# Patient Record
Sex: Female | Born: 2000 | Race: Black or African American | Hispanic: No | Marital: Single | Smoking: Never smoker
Health system: Southern US, Community
[De-identification: ages and names within clinical notes are randomized; demographics above are authoritative.]

## PROBLEM LIST (undated history)

## (undated) DIAGNOSIS — M1990 Unspecified osteoarthritis, unspecified site: Secondary | ICD-10-CM

## (undated) DIAGNOSIS — J45909 Unspecified asthma, uncomplicated: Secondary | ICD-10-CM

## (undated) HISTORY — PX: WISDOM TOOTH EXTRACTION: SHX21

## (undated) HISTORY — DX: Unspecified asthma, uncomplicated: J45.909

## (undated) HISTORY — PX: KELOID EXCISION: SHX1856

---

## 2019-01-23 ENCOUNTER — Emergency Department: Payer: Medicaid Other

## 2019-01-23 ENCOUNTER — Encounter: Payer: Self-pay | Admitting: Emergency Medicine

## 2019-01-23 ENCOUNTER — Other Ambulatory Visit: Payer: Self-pay

## 2019-01-23 ENCOUNTER — Emergency Department
Admission: EM | Admit: 2019-01-23 | Discharge: 2019-01-23 | Disposition: A | Discharge status: Home / Self Care | Payer: Medicaid Other | Attending: Emergency Medicine | Admitting: Emergency Medicine

## 2019-01-23 DIAGNOSIS — R0789 Other chest pain: Secondary | ICD-10-CM | POA: Diagnosis present

## 2019-01-23 DIAGNOSIS — R079 Chest pain, unspecified: Secondary | ICD-10-CM

## 2019-01-23 LAB — CBC WITH DIFFERENTIAL/PLATELET
Abs Immature Granulocytes: 0.04 10*3/uL (ref 0.00–0.07)
Basophils Absolute: 0 10*3/uL (ref 0.0–0.1)
Basophils Relative: 0 %
Eosinophils Absolute: 0.1 10*3/uL (ref 0.0–0.5)
Eosinophils Relative: 1 %
HCT: 34.8 % — ABNORMAL LOW (ref 36.0–46.0)
Hemoglobin: 10.3 g/dL — ABNORMAL LOW (ref 12.0–15.0)
Immature Granulocytes: 0 %
Lymphocytes Relative: 29 %
Lymphs Abs: 2.7 10*3/uL (ref 0.7–4.0)
MCH: 19.3 pg — ABNORMAL LOW (ref 26.0–34.0)
MCHC: 29.6 g/dL — ABNORMAL LOW (ref 30.0–36.0)
MCV: 65.2 fL — ABNORMAL LOW (ref 80.0–100.0)
Monocytes Absolute: 0.7 10*3/uL (ref 0.1–1.0)
Monocytes Relative: 8 %
Neutro Abs: 5.8 10*3/uL (ref 1.7–7.7)
Neutrophils Relative %: 62 %
Platelets: 420 10*3/uL — ABNORMAL HIGH (ref 150–400)
RBC: 5.34 MIL/uL — ABNORMAL HIGH (ref 3.87–5.11)
RDW: 18.9 % — ABNORMAL HIGH (ref 11.5–15.5)
WBC: 9.3 10*3/uL (ref 4.0–10.5)
nRBC: 0 % (ref 0.0–0.2)

## 2019-01-23 LAB — COMPREHENSIVE METABOLIC PANEL
ALT: 14 U/L (ref 0–44)
AST: 16 U/L (ref 15–41)
Albumin: 3.8 g/dL (ref 3.5–5.0)
Alkaline Phosphatase: 55 U/L (ref 38–126)
Anion gap: 9 (ref 5–15)
BUN: 11 mg/dL (ref 6–20)
CO2: 24 mmol/L (ref 22–32)
Calcium: 9 mg/dL (ref 8.9–10.3)
Chloride: 107 mmol/L (ref 98–111)
Creatinine, Ser: 0.85 mg/dL (ref 0.44–1.00)
GFR calc Af Amer: >60 mL/min (ref >60)
GFR calc non Af Amer: >60 mL/min (ref >60)
Glucose, Bld: 95 mg/dL (ref 70–99)
Potassium: 4 mmol/L (ref 3.5–5.1)
Sodium: 140 mmol/L (ref 135–145)
Total Bilirubin: 0.5 mg/dL (ref 0.3–1.2)
Total Protein: 7.4 g/dL (ref 6.5–8.1)

## 2019-01-23 LAB — TROPONIN I (HIGH SENSITIVITY)
Troponin I (High Sensitivity): <2 ng/L (ref <18)
Troponin I (High Sensitivity): <2 ng/L (ref <18)

## 2019-01-23 MED ORDER — FAMOTIDINE 20 MG PO TABS: 20.0000 mg | ORAL_TABLET | Freq: Two times a day (BID) | ORAL | 1 refills | Status: DC

## 2019-01-23 MED ORDER — DIAZEPAM 5 MG PO TABS
5.0000 mg | ORAL_TABLET | Freq: Once | ORAL | Status: AC
  Administered 2019-01-23: 09:00:00 5 mg via ORAL
  Filled 2019-01-23: qty 1

## 2019-01-23 MED ORDER — DIAZEPAM 5 MG PO TABS: 5.0000 mg | ORAL_TABLET | Freq: Three times a day (TID) | ORAL | 0 refills | Status: DC | PRN
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
## 2019-01-23 NOTE — ED Notes (Signed)
Pt ambulated out of department independently. Pt to call friend for ride home.
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
## 2019-01-23 NOTE — ED Provider Notes (Signed)
[facility] Emergency Department Provider Note       Time seen: ----------------------------------------- 8:31 AM on 01/23/2019 -----------------------------------------   I have reviewed the triage vital signs and the nursing notes.  HISTORY   Chief Complaint Chest Pain    HPI Brooke Wall is a 19 y.o. female with a history of thalassemia who presents to the ED for upper chest pain that radiates into her left axilla for last 4 days.  Discomfort is 5 out of 10, nothing makes it better or worse.  She denies fevers, chills or other complaints.  History reviewed. No pertinent past medical history.  There are no problems to display for this patient.   History reviewed. No pertinent surgical history.  Allergies Patient has no known allergies.  Social History Social History   Tobacco Use  . Smoking status: Never Smoker  . Smokeless tobacco: Never Used  Substance Use Topics  . Alcohol use: Not on file  . Drug use: Not on file    Review of Systems Constitutional: Negative for fever. Cardiovascular: Positive for chest pain Respiratory: Negative for shortness of breath. Gastrointestinal: Negative for abdominal pain, vomiting and diarrhea. Musculoskeletal: Negative for back pain. Skin: Negative for rash. Neurological: Negative for headaches, focal weakness or numbness.  All systems negative/normal/unremarkable except as stated in the HPI  ____________________________________________   PHYSICAL EXAM:  VITAL SIGNS: ED Triage Vitals  Enc Vitals Group     BP 01/23/19 0348 135/64     Pulse Rate 01/23/19 0348 75     Resp 01/23/19 0348 19     Temp 01/23/19 0348 99.3 F (37.4 C)     Temp Source 01/23/19 0348 Oral     SpO2 01/23/19 0348 100 %     Weight 01/23/19 0346 240 lb (108.9 kg)     Height 01/23/19 0346 5\' 7"  (1.702 m)     Head Circumference --      Peak Flow --      Pain Score 01/23/19 0346 5     Pain Loc --      Pain Edu? --       Excl. in GC? --     Constitutional: Alert and oriented. Well appearing and in no distress. Eyes: Conjunctivae are normal. Normal extraocular movements. ENT      Head: Normocephalic and atraumatic.      Nose: No congestion/rhinnorhea.      Mouth/Throat: Mucous membranes are moist.      Neck: No stridor. Cardiovascular: Normal rate, regular rhythm. No murmurs, rubs, or gallops. Respiratory: Normal respiratory effort without tachypnea nor retractions. Breath sounds are clear and equal bilaterally. No wheezes/rales/rhonchi. Gastrointestinal: Soft and nontender. Normal bowel sounds Musculoskeletal: Nontender with normal range of motion in extremities. No lower extremity tenderness nor edema. Neurologic:  Normal speech and language. No gross focal neurologic deficits are appreciated.  Skin:  Skin is warm, dry and intact. No rash noted. Psychiatric: Mood and affect are normal. Speech and behavior are normal.  ____________________________________________  EKG: Interpreted by me.  Sinus rhythm with a rate of 73 bpm, normal PR interval, normal QRS, normal QT  ____________________________________________  ED COURSE:  As part of my medical decision making, I reviewed the following data within the electronic MEDICAL RECORD NUMBER History obtained from family if available, nursing notes, old chart and ekg, as well as notes from prior ED visits. Patient presented for chest pain, we will assess with labs and imaging as indicated at this time.   Procedures  03/23/19  Brooke Wall was evaluated in Emergency Department on 01/23/2019 for the symptoms described in the history of present illness. She was evaluated in the context of the global COVID-19 pandemic, which necessitated consideration that the patient might be at risk for infection with the SARS-CoV-2 virus that causes COVID-19. Institutional protocols and algorithms that pertain to the evaluation of patients at risk for COVID-19 are in a state of rapid change  based on information released by regulatory bodies including the CDC and federal and state organizations. These policies and algorithms were followed during the patient's care in the ED.  ____________________________________________   LABS (pertinent positives/negatives)  Labs Reviewed  CBC WITH DIFFERENTIAL/PLATELET - Abnormal; Notable for the following components:      Result Value   RBC 5.34 (*)    Hemoglobin 10.3 (*)    HCT 34.8 (*)    MCV 65.2 (*)    MCH 19.3 (*)    MCHC 29.6 (*)    RDW 18.9 (*)    Platelets 420 (*)    All other components within normal limits  COMPREHENSIVE METABOLIC PANEL  TROPONIN I (HIGH SENSITIVITY)  TROPONIN I (HIGH SENSITIVITY)    RADIOLOGY  Chest x-ray is unremarkable  ____________________________________________   DIFFERENTIAL DIAGNOSIS   Musculoskeletal pain, GERD, anxiety, anemia, angina unlikely  FINAL ASSESSMENT AND PLAN  Chest pain   Plan: The patient had presented for nonspecific chest pain. Patient's labs were reassuring. Patient's imaging was also unremarkable.  No clear etiology was discovered, I will try antacids and muscle relaxants.  She is cleared for outpatient follow-up.   Laurence Aly, MD    Note: This note was generated in part or whole with voice recognition software. Voice recognition is usually quite accurate but there are transcription errors that can and very often do occur. I apologize for any typographical errors that were not detected and corrected.     Earleen Newport, MD 01/23/19 [id number]

## 2019-02-06 ENCOUNTER — Ambulatory Visit: Payer: Medicaid Other | Attending: Internal Medicine

## 2019-02-06 DIAGNOSIS — Z20822 Contact with and (suspected) exposure to covid-19: Secondary | ICD-10-CM

## 2019-02-07 LAB — NOVEL CORONAVIRUS, NAA: SARS-CoV-2, NAA: NOT DETECTED

## 2019-06-29 ENCOUNTER — Encounter (HOSPITAL_BASED_OUTPATIENT_CLINIC_OR_DEPARTMENT_OTHER): Payer: Self-pay | Admitting: *Deleted

## 2019-06-29 ENCOUNTER — Ambulatory Visit (HOSPITAL_COMMUNITY)
Admission: EM | Admit: 2019-06-29 | Discharge: 2019-06-29 | Disposition: A | Discharge status: Home / Self Care | Payer: No Typology Code available for payment source | Source: Ambulatory Visit | Attending: Emergency Medicine | Admitting: Emergency Medicine

## 2019-06-29 ENCOUNTER — Emergency Department (HOSPITAL_BASED_OUTPATIENT_CLINIC_OR_DEPARTMENT_OTHER)
Admission: EM | Admit: 2019-06-29 | Discharge: 2019-06-29 | Disposition: A | Discharge status: Home / Self Care | Payer: Medicaid Other | Attending: Emergency Medicine | Admitting: Emergency Medicine

## 2019-06-29 ENCOUNTER — Other Ambulatory Visit: Payer: Self-pay

## 2019-06-29 DIAGNOSIS — Z79899 Other long term (current) drug therapy: Secondary | ICD-10-CM | POA: Diagnosis not present

## 2019-06-29 DIAGNOSIS — Z0441 Encounter for examination and observation following alleged adult rape: Secondary | ICD-10-CM | POA: Insufficient documentation

## 2019-06-29 DIAGNOSIS — T7421XA Adult sexual abuse, confirmed, initial encounter: Secondary | ICD-10-CM

## 2019-06-29 DIAGNOSIS — E669 Obesity, unspecified: Secondary | ICD-10-CM | POA: Insufficient documentation

## 2019-06-29 HISTORY — DX: Unspecified osteoarthritis, unspecified site: M19.90

## 2019-06-29 LAB — PREGNANCY, URINE: Preg Test, Ur: NEGATIVE

## 2019-06-29 MED ORDER — AZITHROMYCIN 250 MG PO TABS
1000.0000 mg | ORAL_TABLET | Freq: Once | ORAL | Status: AC
  Administered 2019-06-29: 1000 mg via ORAL

## 2019-06-29 MED ORDER — PROMETHAZINE HCL 25 MG PO TABS
25.0000 mg | ORAL_TABLET | Freq: Four times a day (QID) | ORAL | Status: DC | PRN
  Administered 2019-06-29: 75 mg via ORAL

## 2019-06-29 MED ORDER — ULIPRISTAL ACETATE 30 MG PO TABS
30.0000 mg | ORAL_TABLET | Freq: Once | ORAL | Status: AC
  Administered 2019-06-29: 30 mg via ORAL
  Filled 2019-06-29: qty 1

## 2019-06-29 MED ORDER — METRONIDAZOLE 500 MG PO TABS
2000.0000 mg | ORAL_TABLET | Freq: Once | ORAL | Status: AC
  Administered 2019-06-29: 2000 mg via ORAL

## 2019-06-29 MED ORDER — LIDOCAINE HCL (PF) 1 % IJ SOLN
0.9000 mL | Freq: Once | INTRAMUSCULAR | Status: AC
  Administered 2019-06-29: 0.9 mL

## 2019-06-29 MED ORDER — CEFTRIAXONE SODIUM 500 MG IJ SOLR
250.0000 mg | Freq: Once | INTRAMUSCULAR | Status: AC
  Administered 2019-06-29: 500 mg via INTRAMUSCULAR
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
## 2019-06-29 NOTE — ED Notes (Signed)
Patient unable to wait to use the bathroom.  Instructed to pat dry and place tissue in paper bag which remains in the room currently with the patient.   Mother at the bedside.
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
## 2019-06-29 NOTE — ED Notes (Signed)
Pt is aware that we are awaiting Sane Evaluation, which will be closer to 6-7am. Dr. Wilkie Aye would like pt to remain NPO and she is aware of this. Girlfriend at bedside.
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
## 2019-06-29 NOTE — Discharge Instructions (Signed)
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 If you miss a dose, take it as soon as you can. If it is almost time for your next dose, take only that dose. Do  not take double or extra doses. What may interact with this medicine? Do not take this medicine with any of the following medications:  alcohol or any product that contains alcohol  cisapride  disulfiram  dronedarone  pimozide  thioridazine This medicine may also interact with the following medications:  amiodarone  birth control pills  busulfan  carbamazepine  cimetidine  cyclosporine  fluorouracil  lithium  other medicines that prolong the QT interval (cause an abnormal heart rhythm) like dofetilide, ziprasidone  phenobarbital  phenytoin  quinidine  tacrolimus  vecuronium  warfarin This list may not describe all possible interactions. Give your health care  provider a list of all the medicines, herbs, non-prescription drugs, or dietary supplements you use. Also tell them if you smoke, drink alcohol, or use illegal drugs. Some items may interact with your medicine. What should I watch for while using this medicine? Tell your doctor or health care professional if your symptoms do not improve or if they get worse. You may get drowsy or dizzy. Do not drive, use machinery, or do anything that needs mental alertness until you know how this medicine affects you. Do not stand or sit up quickly, especially if you are an older patient. This reduces the risk of dizzy or fainting spells. Ask your doctor or health care professional if you should avoid alcohol. Many nonprescription cough and cold products contain alcohol. Metronidazole can cause an unpleasant reaction when taken with alcohol. The reaction includes flushing, headache, nausea, vomiting, sweating, and increased thirst. The reaction can last from 30 minutes to several hours. If you are being treated for a sexually transmitted disease, avoid sexual contact until you have finished your treatment. Your sexual partner may also need treatment. What side effects may I notice from receiving this medicine? Side effects that you should report to your doctor or health care professional as soon as possible:  allergic reactions like skin rash or hives, swelling of the face, lips, or tongue  confusion  fast, irregular heartbeat  fever, chills, sore throat  fever with rash, swollen lymph nodes, or swelling of the face  pain, tingling, numbness in the hands or feet  redness, blistering, peeling or loosening of the skin, including inside the mouth  seizures  sign and symptoms of liver injury like dark yellow or brown urine; general ill feeling or flu-like symptoms; light colored stools; loss of appetite; nausea; right upper belly pain; unusually weak or tired; yellowing of the eyes or skin  vaginal discharge,  itching, or odor in women Side effects that usually do not require medical attention (report to your doctor or health care professional if they continue or are bothersome):  changes in taste  diarrhea  headache  nausea, vomiting  stomach pain This list may not describe all possible side effects. Call your doctor for medical advice about side effects. You may report side effects to FDA at 1-800-FDA-1088. Where should I keep my medicine? Keep out of the reach of children. Store at room temperature below 25 degrees C (77 degrees F). Protect from light. Keep container tightly closed. Throw away any unused medicine after the expiration date. NOTE: This sheet is a summary. It may not cover all possible information. If you have questions about this medicine, talk to your doctor, pharmacist, or health care provider.  2020 Elsevier/Gold Standard (2017-12-31 06:52:33)   Ceftriaxone (Injection/Shot) Also known as:  Rocephin  Ceftriaxone injection What is this medicine? CEFTRIAXONE (sef try AX one) is a cephalosporin antibiotic. It is  used to treat certain kinds of bacterial infections. It will not work for colds, flu, or other viral infections. This medicine may be used for other purposes; ask your health care provider or pharmacist if you have questions. COMMON BRAND NAME(S): Ceftrisol Plus, Rocephin What should I tell my health care provider before I take this medicine? They need to know if you have any of these conditions:  any chronic illness  bowel disease, like colitis  both kidney and liver disease  high bilirubin level in newborn patients  an unusual or allergic reaction to ceftriaxone, other cephalosporin or penicillin antibiotics, foods, dyes, or preservatives  pregnant or trying to get pregnant  breast-feeding How should I use this medicine? This medicine is injected into a muscle or infused it into a vein. It is usually given in a medical office or clinic. If you are to  give this medicine you will be taught how to inject it. Follow instructions carefully. Use your doses at regular intervals. Do not take your medicine more often than directed. Do not skip doses or stop your medicine early even if you feel better. Do not stop taking except on your doctor's advice. Talk to your pediatrician regarding the use of this medicine in children. Special care may be needed. Overdosage: If you think you have taken too much of this medicine contact a poison control center or emergency room at once. NOTE: This medicine is only for you. Do not share this medicine with others. What if I miss a dose? If you miss a dose, take it as soon as you can. If it is almost time for your next dose, take only that dose. Do not take double or extra doses. What may interact with this medicine? Do not take this medicine with any of the following medications:  intravenous calcium This medicine may also interact with the following medications:  birth control pills This list may not describe all possible interactions. Give your health care provider a list of all the medicines, herbs, non-prescription drugs, or dietary supplements you use. Also tell them if you smoke, drink alcohol, or use illegal drugs. Some items may interact with your medicine. What should I watch for while using this medicine? Tell your doctor or health care provider if your symptoms do not improve or if they get worse. This medicine may cause serious skin reactions. They can happen weeks to months after starting the medicine. Contact your health care provider right away if you notice fevers or flu-like symptoms with a rash. The rash may be red or purple and then turn into blisters or peeling of the skin. Or, you might notice a red rash with swelling of the face, lips or lymph nodes in your neck or under your arms. Do not treat diarrhea with over the counter products. Contact your doctor if you have diarrhea that lasts more than 2  days or if it is severe and watery. If you are being treated for a sexually transmitted disease, avoid sexual contact until you have finished your treatment. Having sex can infect your sexual partner. Calcium may bind to this medicine and cause lung or kidney problems. Avoid calcium products while taking this medicine and for 48 hours after taking the last dose of this medicine. What side effects may I notice from receiving this medicine? Side effects that you should report to your doctor or health care professional as soon as possible:  allergic reactions like skin rash, itching or hives, swelling of the face,  lips, or tongue  breathing problems  fever, chills  irregular heartbeat  pain when passing urine  redness, blistering, peeling, or loosening of the skin, including inside the mouth  seizures  stomach pain, cramps  unusual bleeding, bruising  unusually weak or tired Side effects that usually do not require medical attention (report to your doctor or health care professional if they continue or are bothersome):  diarrhea  dizzy, drowsy  headache  nausea, vomiting  pain, swelling, irritation where injected  stomach upset  sweating This list may not describe all possible side effects. Call your doctor for medical advice about side effects. You may report side effects to FDA at 1-800-FDA-1088. Where should I keep my medicine? Keep out of the reach of children. Store at room temperature below 25 degrees C (77 degrees F). Protect from light. Throw away any unused vials after the expiration date. NOTE: This sheet is a summary. It may not cover all possible information. If you have questions about this medicine, talk to your doctor, pharmacist, or health care provider.  2020 Elsevier/Gold Standard (2018-04-11 10:10:06)    Promethazine (pack of 3 for home use) Also known as:  Phenergan  Promethazine tablets  What is this medicine? PROMETHAZINE (proe METH a zeen)  is an antihistamine. It is used to treat allergic reactions and to treat or prevent nausea and vomiting from illness or motion sickness. It is also used to make you sleep before surgery, and to help treat pain or nausea after surgery. This medicine may be used for other purposes; ask your health care provider or pharmacist if you have questions. COMMON BRAND NAME(S): Phenergan What should I tell my health care provider before I take this medicine? They need to know if you have any of these conditions:  glaucoma  high blood pressure or heart disease  kidney disease  liver disease  lung or breathing disease, like asthma  prostate trouble  pain or difficulty passing urine  seizures  an unusual or allergic reaction to promethazine or phenothiazines, other medicines, foods, dyes, or preservatives  pregnant or trying to get pregnant  breast-feeding How should I use this medicine? Take this medicine by mouth with a glass of water. Follow the directions on the prescription label. Take your doses at regular intervals. Do not take your medicine more often than directed. Talk to your pediatrician regarding the use of this medicine in children. Special care may be needed. This medicine should not be given to infants and children younger than 63 years old. Overdosage: If you think you have taken too much of this medicine contact a poison control center or emergency room at once. NOTE: This medicine is only for you. Do not share this medicine with others. What if I miss a dose? If you miss a dose, take it as soon as you can. If it is almost time for your next dose, take only that dose. Do not take double or extra doses. What may interact with this medicine? Do not take this medicine with any of the following medications:  cisapride  dronedarone  MAOIs like Carbex, Eldepryl, Marplan, Nardil, Parnate  pimozide  quinidine, including dextromethorphan; quinidine  thioridazine This  medicine may also interact with the following medications:  certain medicines for depression, anxiety, or psychotic disturbances  certain medicines for anxiety or sleep  certain medicines for seizures like carbamazepine, phenobarbital, phenytoin  certain medicines for movement abnormalities as in Parkinson's disease, or for gastrointestinal problems  epinephrine  medicines for allergies  or colds  muscle relaxants  narcotic medicines for pain  other medicines that prolong the QT interval (cause an abnormal heart rhythm) like dofetilide, ziprasidone  tramadol  trimethobenzamide This list may not describe all possible interactions. Give your health care provider a list of all the medicines, herbs, non-prescription drugs, or dietary supplements you use. Also tell them if you smoke, drink alcohol, or use illegal drugs. Some items may interact with your medicine. What should I watch for while using this medicine? Tell your doctor or health care professional if your symptoms do not start to get better in 1 to 2 days. You may get drowsy or dizzy. Do not drive, use machinery, or do anything that needs mental alertness until you know how this medicine affects you. To reduce the risk of dizzy or fainting spells, do not stand or sit up quickly, especially if you are an older patient. Alcohol may increase dizziness and drowsiness. Avoid alcoholic drinks. Your mouth may get dry. Chewing sugarless gum or sucking hard candy, and drinking plenty of water may help. Contact your doctor if the problem does not go away or is severe. This medicine may cause dry eyes and blurred vision. If you wear contact lenses you may feel some discomfort. Lubricating drops may help. See your eye doctor if the problem does not go away or is severe. This medicine can make you more sensitive to the sun. Keep out of the sun. If you cannot avoid being in the sun, wear protective clothing and use sunscreen. Do not use sun lamps  or tanning beds/booths. If you are diabetic, check your blood-sugar levels regularly. What side effects may I notice from receiving this medicine? Side effects that you should report to your doctor or health care professional as soon as possible:  blurred vision  irregular heartbeat, palpitations or chest pain  muscle or facial twitches  pain or difficulty passing urine  seizures  skin rash  slowed or shallow breathing  unusual bleeding or bruising  yellowing of the eyes or skin Side effects that usually do not require medical attention (report to your doctor or health care professional if they continue or are bothersome):  headache  nightmares, agitation, nervousness, excitability, not able to sleep (these are more likely in children)  stuffy nose This list may not describe all possible side effects. Call your doctor for medical advice about side effects. You may report side effects to FDA at 1-800-FDA-1088. Where should I keep my medicine? Keep out of the reach of children. Store at room temperature, between 20 and 25 degrees C (68 and 77 degrees F). Protect from light. Throw away any unused medicine after the expiration date. NOTE: This sheet is a summary. It may not cover all possible information. If you have questions about this medicine, talk to your doctor, pharmacist, or health care provider.  2020 Elsevier/Gold Standard (2017-12-31 08:46:17)

## 2020-09-28 IMAGING — CR DG CHEST 2V
2 series · 2 of 2 positions shown · non-contrast
Comparison: None.

CLINICAL DATA: Initial evaluation for acute chest pain.

EXAM:
CHEST - 2 VIEW

[chest pa]
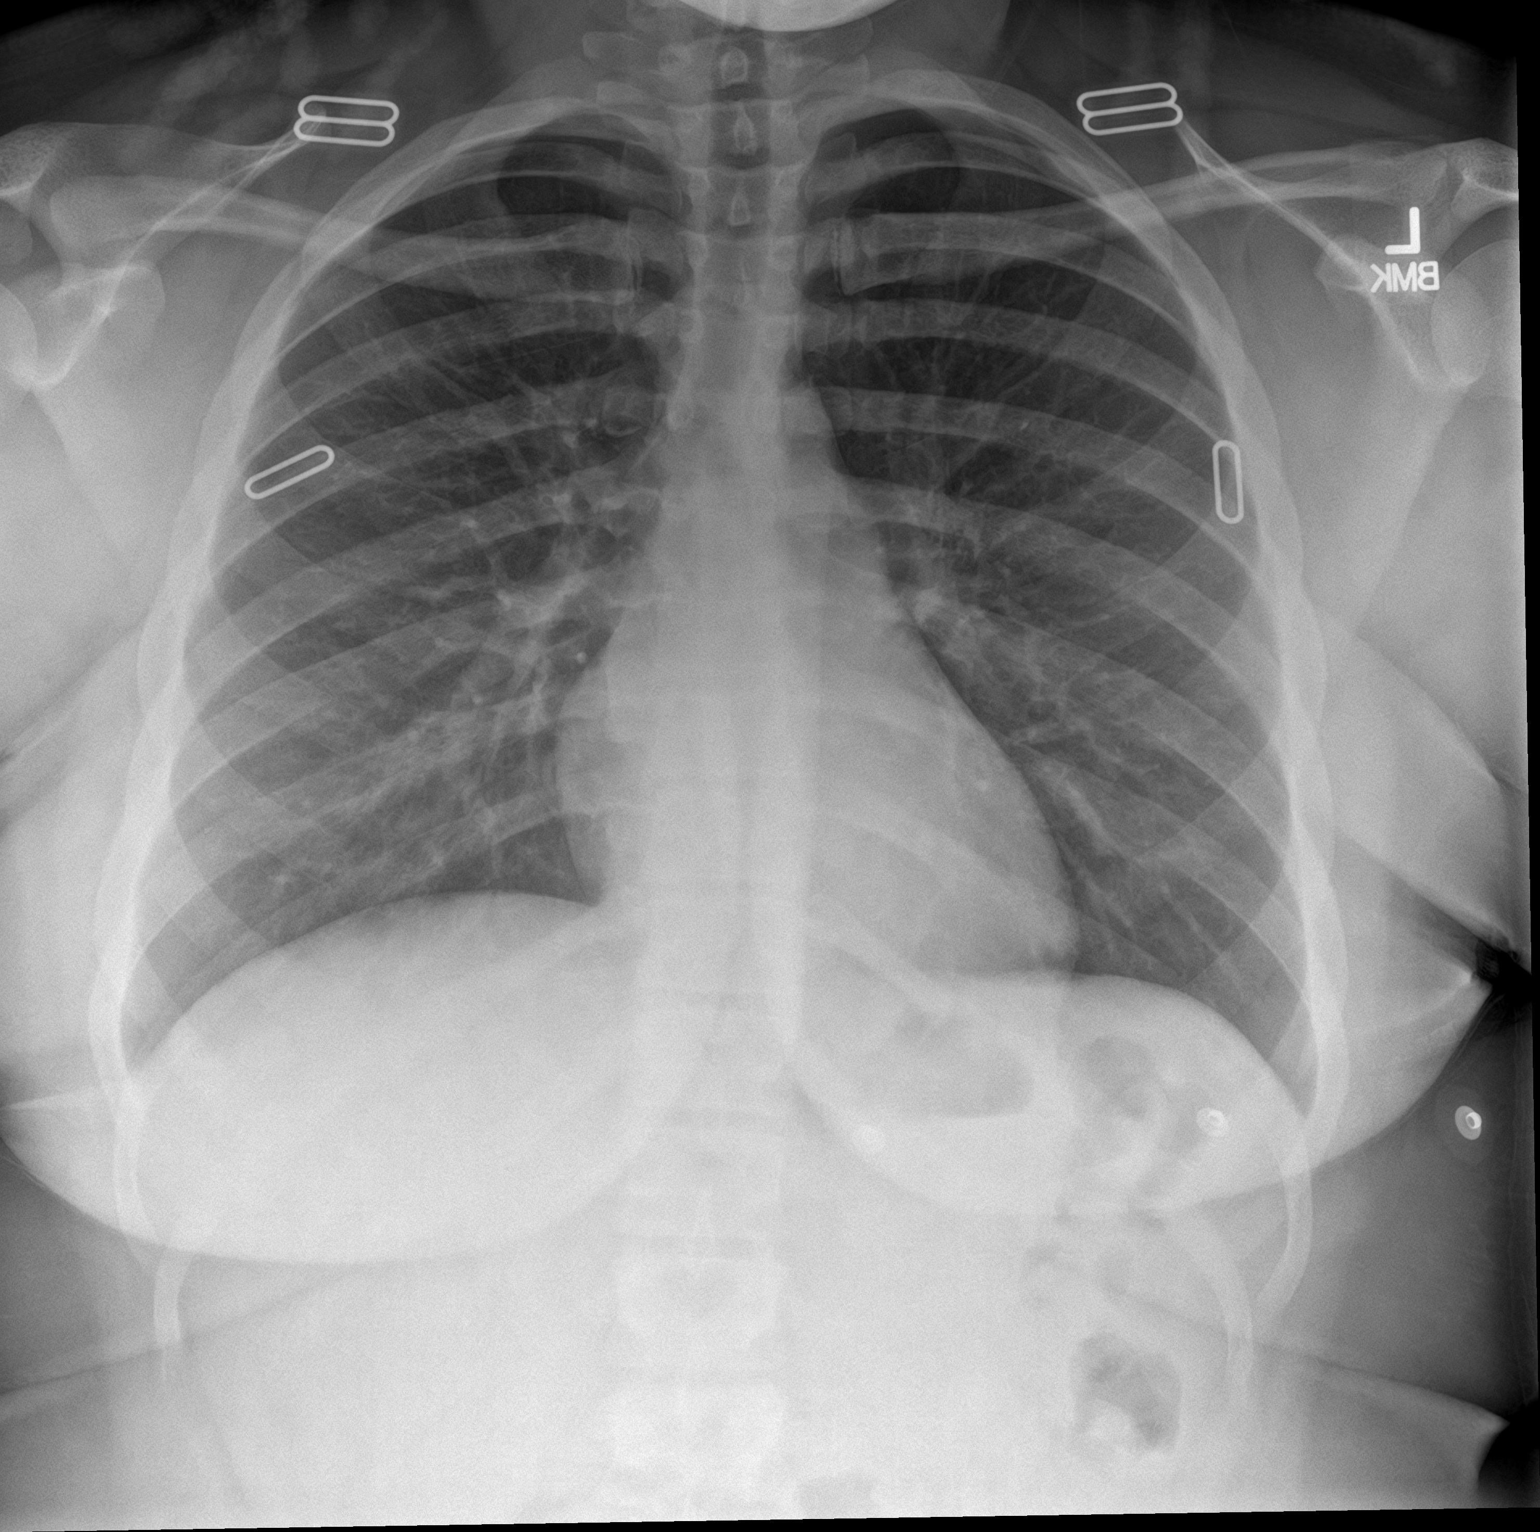

[chest lat]
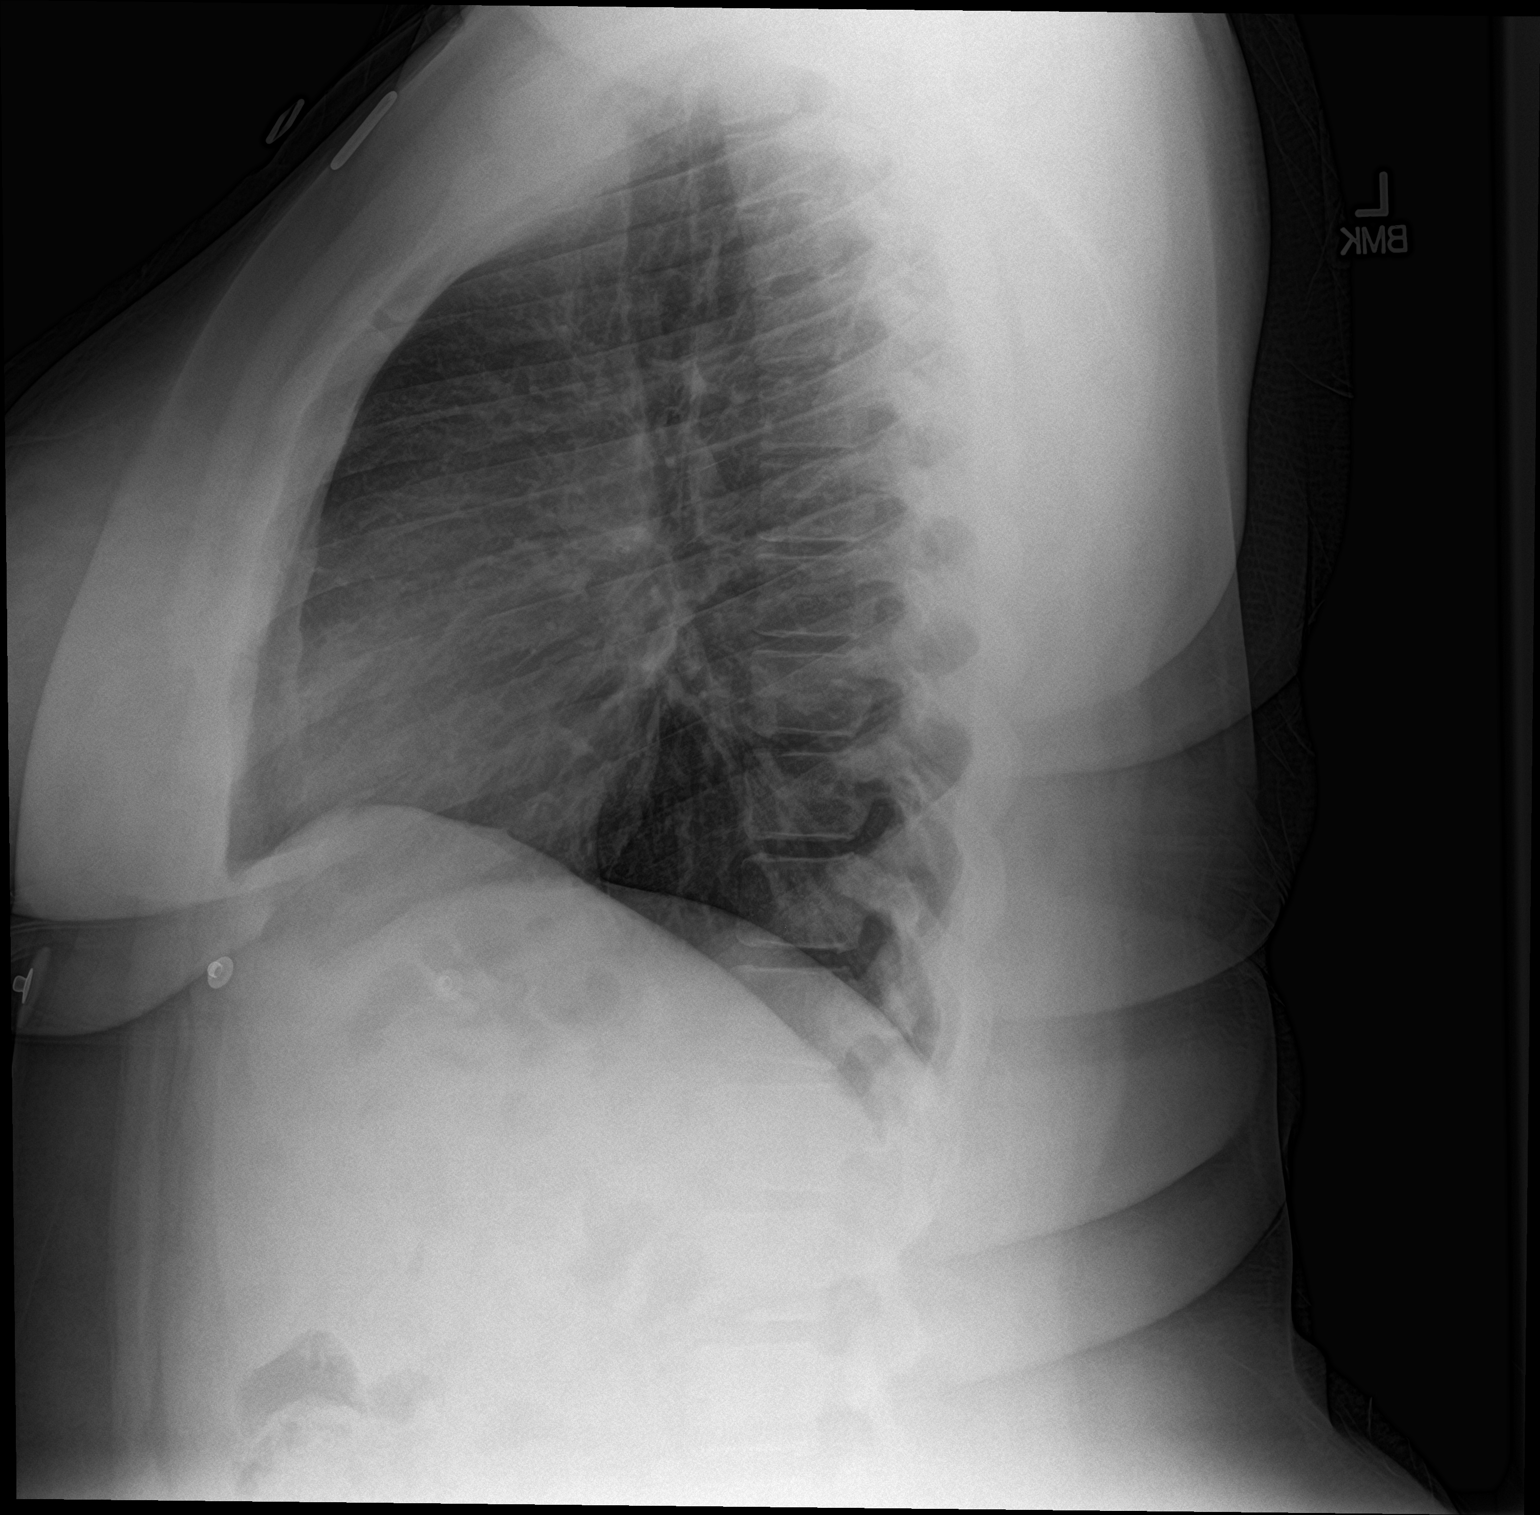

[2 of 2 positions shown; findings below may reference images not displayed]

FINDINGS: The cardiac and mediastinal silhouettes are within normal limits.

The lungs are normally inflated. No airspace consolidation, pleural
effusion, or pulmonary edema. No pneumothorax.

No acute osseous abnormality.
IMPRESSION: No active cardiopulmonary disease.

## 2020-10-12 ENCOUNTER — Emergency Department (HOSPITAL_COMMUNITY): Payer: Medicaid Other

## 2020-10-12 ENCOUNTER — Emergency Department (HOSPITAL_COMMUNITY)
Admission: EM | Admit: 2020-10-12 | Discharge: 2020-10-12 | Disposition: A | Discharge status: Home / Self Care | Payer: Medicaid Other | Attending: Emergency Medicine | Admitting: Emergency Medicine

## 2020-10-12 ENCOUNTER — Other Ambulatory Visit: Payer: Self-pay

## 2020-10-12 DIAGNOSIS — R0789 Other chest pain: Secondary | ICD-10-CM | POA: Diagnosis not present

## 2020-10-12 DIAGNOSIS — R0602 Shortness of breath: Secondary | ICD-10-CM | POA: Diagnosis not present

## 2020-10-12 DIAGNOSIS — R062 Wheezing: Secondary | ICD-10-CM | POA: Insufficient documentation

## 2020-10-12 DIAGNOSIS — Z5321 Procedure and treatment not carried out due to patient leaving prior to being seen by health care provider: Secondary | ICD-10-CM | POA: Insufficient documentation

## 2020-10-12 DIAGNOSIS — R079 Chest pain, unspecified: Secondary | ICD-10-CM | POA: Insufficient documentation

## 2020-10-12 LAB — CBC
HCT: 34.1 % — ABNORMAL LOW (ref 36.0–46.0)
Hemoglobin: 10 g/dL — ABNORMAL LOW (ref 12.0–15.0)
MCH: 18.4 pg — ABNORMAL LOW (ref 26.0–34.0)
MCHC: 29.3 g/dL — ABNORMAL LOW (ref 30.0–36.0)
MCV: 62.7 fL — ABNORMAL LOW (ref 80.0–100.0)
Platelets: 486 10*3/uL — ABNORMAL HIGH (ref 150–400)
RBC: 5.44 MIL/uL — ABNORMAL HIGH (ref 3.87–5.11)
RDW: 20.5 % — ABNORMAL HIGH (ref 11.5–15.5)
WBC: 11.1 10*3/uL — ABNORMAL HIGH (ref 4.0–10.5)
nRBC: 0 % (ref 0.0–0.2)

## 2020-10-12 LAB — BASIC METABOLIC PANEL
Anion gap: 8 (ref 5–15)
BUN: 6 mg/dL (ref 6–20)
CO2: 26 mmol/L (ref 22–32)
Calcium: 9.3 mg/dL (ref 8.9–10.3)
Chloride: 103 mmol/L (ref 98–111)
Creatinine, Ser: 0.82 mg/dL (ref 0.44–1.00)
GFR, Estimated: >60 mL/min (ref >60)
Glucose, Bld: 94 mg/dL (ref 70–99)
Potassium: 4.3 mmol/L (ref 3.5–5.1)
Sodium: 137 mmol/L (ref 135–145)

## 2020-10-12 LAB — TROPONIN I (HIGH SENSITIVITY)
Troponin I (High Sensitivity): <2 ng/L (ref <18)
Troponin I (High Sensitivity): <2 ng/L (ref <18)

## 2020-10-12 LAB — I-STAT BETA HCG BLOOD, ED (MC, WL, AP ONLY): I-stat hCG, quantitative: <5 m[IU]/mL (ref <5)

## 2020-10-12 NOTE — ED Provider Notes (Signed)
Emergency Medicine Provider Triage Evaluation Note  Brooke Wall , a 20 y.o. female  was evaluated in triage.  Pt complains of chest pain.  The patient reports constant, nonradiating chest pain, characterized as tightness, that began 2 days ago.  Pain is pleuritic.  No other known aggravating or alleviating factors.  She reports mild associated shortness of breath.  No cough, fever, chills, nausea, vomiting, diarrhea, back pain, abdominal pain, leg swelling, palpitations.  She has a childhood history of asthma, but has had no asthma exacerbations in a long time.  No treatment prior to arrival.  She does not take OCPs.  No recent surgery or long travel.  No history of VTE.  Review of Systems  Positive: Chest pain, shortness of breath Negative: Cough, fever, chills, nausea, vomiting, diarrhea, back pain, abdominal pain, leg swelling, palpitation  Physical Exam  BP 135/70 (BP Location: Right Arm)   Pulse 80   Temp 98.5 F (36.9 C)   Resp 17   LMP 09/26/2020   SpO2 100%  Gen:   Awake, no distress   Resp:  Normal effort  MSK:   Moves extremities without difficulty  Other:  Lungs are clear to auscultation bilaterally.  No increased work of breathing.  No adventitious breath sounds.  Medical Decision Making  Medically screening exam initiated at 12:36 AM.  Appropriate orders placed.  Brooke Wall was informed that the remainder of the evaluation will be completed by another provider, this initial triage assessment does not replace that evaluation, and the importance of remaining in the ED until their evaluation is complete.  Labs and imaging have been ordered.  She will require further work-up and evaluation in the emergency department.   Barkley Boards, PA-C 10/12/20 [id number]    Glynn Octave, MD 10/12/20 [phone]

## 2020-10-12 NOTE — ED Triage Notes (Signed)
Pt with left sided chest tightness, some sob, pain increased with breathing

## 2021-05-23 ENCOUNTER — Telehealth: Payer: Medicaid Other | Admitting: Physician Assistant

## 2021-05-23 DIAGNOSIS — T7840XA Allergy, unspecified, initial encounter: Secondary | ICD-10-CM | POA: Diagnosis not present

## 2021-05-23 MED ORDER — CEPHALEXIN 500 MG PO CAPS: 500.0000 mg | ORAL_CAPSULE | Freq: Three times a day (TID) | ORAL | 0 refills | Status: AC

## 2021-05-23 MED ORDER — METHYLPREDNISOLONE 4 MG PO TBPK: ORAL_TABLET | ORAL | 0 refills | Status: DC

## 2021-05-23 NOTE — Progress Notes (Signed)
?Virtual Visit Consent  ? ?Brooke Wall, you are scheduled for a virtual visit with a [facility] provider today.   ?  ?Just as with appointments in the office, your consent must be obtained to participate.  Your consent will be active for this visit and any virtual visit you may have with one of our providers in the next 365 days.   ?  ?If you have a MyChart account, a copy of this consent can be sent to you electronically.  All virtual visits are billed to your insurance company just like a traditional visit in the office.   ? ?As this is a virtual visit, video technology does not allow for your provider to perform a traditional examination.  This may limit your provider's ability to fully assess your condition.  If your provider identifies any concerns that need to be evaluated in person or the need to arrange testing (such as labs, EKG, etc.), we will make arrangements to do so.   ?  ?Although advances in technology are sophisticated, we cannot ensure that it will always work on either your end or our end.  If the connection with a video visit is poor, the visit may have to be switched to a telephone visit.  With either a video or telephone visit, we are not always able to ensure that we have a secure connection.   ? ?Also, by engaging in this virtual visit, you consent to the provision of healthcare. Additionally, you authorize for your insurance to be billed (if applicable) for the services provided during this visit. I also discussed with the patient that there may be a patient responsible charge related to this service. ? ?I need to obtain your verbal consent now.   Are you willing to proceed with your visit today?  ?  ?Leelee Bearden has provided verbal consent on 05/23/2021 for a virtual visit (video or telephone). ?  ?Leeanne Rio, PA-C  ? ?Date: 05/23/2021 10:28 AM ? ? ?Virtual Visit via Video Note  ? ?ILeeanne Rio, connected with  Sheilyn Orrison  ([id number], 04-Feb-2000) on 05/23/21 at 10:00  AM EDT by a video-enabled telemedicine application and verified that I am speaking with the correct person using two identifiers. ? ?Location: ?Patient: Virtual Visit Location Patient: Home ?Provider: Virtual Visit Location Provider: Home Office ?  ?I discussed the limitations of evaluation and management by telemedicine and the availability of in person appointments. The patient expressed understanding and agreed to proceed.   ? ?History of Present Illness: ?Brooke Wall is a 21 y.o. who identifies as a female who was assigned female at birth, and is being seen today for itchy and swollen bites of the left side of her neck along with a bite of fingers of R hand with substantial swelling. Symptoms first noted upon waking this morning. Notes areas are quite itchy and warm to the touch without pain. Denies drainage from the areas. Denies fever, chills or malaise. Denies any recent travel or sick contact. No new pets. Denies new furniture, etc.  ? ?HPI: HPI  ?Problems: There are no problems to display for this patient. ?  ?Allergies: No Known Allergies ?Medications:  ?Current Outpatient Medications:  ?  cephALEXin (KEFLEX) 500 MG capsule, Take 1 capsule (500 mg total) by mouth 3 (three) times daily for 7 days., Disp: 21 capsule, Rfl: 0 ?  methylPREDNISolone (MEDROL DOSEPAK) 4 MG TBPK tablet, Take following package directions., Disp: 21 tablet, Rfl: 0 ? ?Observations/Objective: ?Patient is well-developed,  well-nourished in no acute distress.  ?Resting comfortably at home.  ?Head is normocephalic, atraumatic.  ?No labored breathing. ?Speech is clear and coherent with logical content.  ?Patient is alert and oriented at baseline.  ?Three quarter-sized bites of neck with erythema and focal soft tissue swelling noted of L anterior and lateral neck. Swelling of third and fourth phalanges of R hand noted with small bite noted of the hand with more localized soft tissue swelling. Again no drainage. ROM of hand intact.   ? ?Assessment and Plan: ?1. Allergic reaction, initial encounter ?- methylPREDNISolone (MEDROL DOSEPAK) 4 MG TBPK tablet; Take following package directions.  Dispense: 21 tablet; Refill: 0 ? ?Unclear etiology. No other lesions of dorsal part of her body to increase suspicion of bed bugs/fleas. Concern for other mite bite with substantial localized reactions. She is to keep skin clean and dry. Wash bed linens in hot water, dry and store in an air-tight container for a week before re-washing and using again. Benadryl OTC and cortisone cream as directed. Cool compresses. Rx Medrol dose pack to help reduce allergic inflammation. Discussed signs and symptoms of a superimposed cellulitis. Put Rx Keflex TID on file at pharmacy to start as directed if these occur.  ? ?Follow Up Instructions: ?I discussed the assessment and treatment plan with the patient. The patient was provided an opportunity to ask questions and all were answered. The patient agreed with the plan and demonstrated an understanding of the instructions.  A copy of instructions were sent to the patient via MyChart unless otherwise noted below.  ? ?The patient was advised to call back or seek an in-person evaluation if the symptoms worsen or if the condition fails to improve as anticipated. ? ?Time:  ?I spent 12 minutes with the patient via telehealth technology discussing the above problems/concerns.   ? ?Leeanne Rio, PA-C ?

## 2021-05-23 NOTE — Patient Instructions (Signed)
?  Brooke Wall, thank you for joining Piedad Climes, PA-C for today's virtual visit.  While this provider is not your primary care provider (PCP), if your PCP is located in our provider database this encounter information will be shared with them immediately following your visit. ? ?Consent: ?(Patient) Brooke Wall provided verbal consent for this virtual visit at the beginning of the encounter. ? ?Current Medications: ? ?Current Outpatient Medications:  ?  cephALEXin (KEFLEX) 500 MG capsule, Take 1 capsule (500 mg total) by mouth 3 (three) times daily for 7 days., Disp: 21 capsule, Rfl: 0 ?  methylPREDNISolone (MEDROL DOSEPAK) 4 MG TBPK tablet, Take following package directions., Disp: 21 tablet, Rfl: 0  ? ?Medications ordered in this encounter:  ?Meds ordered this encounter  ?Medications  ? methylPREDNISolone (MEDROL DOSEPAK) 4 MG TBPK tablet  ?  Sig: Take following package directions.  ?  Dispense:  21 tablet  ?  Refill:  0  ?  Order Specific Question:   Supervising Provider  ?  Answer:   Eber Hong [[id number]]  ? cephALEXin (KEFLEX) 500 MG capsule  ?  Sig: Take 1 capsule (500 mg total) by mouth 3 (three) times daily for 7 days.  ?  Dispense:  21 capsule  ?  Refill:  0  ?  Order Specific Question:   Supervising Provider  ?  Answer:   Eber Hong [[id number]]  ?  ? ?*If you need refills on other medications prior to your next appointment, please contact your pharmacy* ? ?Follow-Up: ?Call back or seek an in-person evaluation if the symptoms worsen or if the condition fails to improve as anticipated. ? ?Other Instructions ?Please keep skin clean and dry. ?Apply cold compresses and OTC cortisone cream as discussed.  ?You can take Benadryl OTC to help with itching and swelling. ?Take the steroid as directed. ? ?If you note itching resolving but redness increasing with any tenderness or pain, start the antibiotic and take as directed.  ? ? ?If you have been instructed to have an in-person evaluation today at a local  Urgent Care facility, please use the link below. It will take you to a list of all of our available [facility], including address, phone number and hours of operation. Please do not delay care.  ?[facility] ? ?If you or a family member do not have a primary care provider, use the link below to schedule a visit and establish care. When you choose a [facility] primary care physician or advanced practice provider, you gain a long-term partner in health. ?Find a Primary Care Provider ? ?Learn more about [facility]'s in-office and virtual care options: ?[facility] Get Care Now  ?

## 2021-09-05 ENCOUNTER — Telehealth: Payer: Medicaid Other | Admitting: Physician Assistant

## 2021-09-05 DIAGNOSIS — R112 Nausea with vomiting, unspecified: Secondary | ICD-10-CM

## 2021-09-05 DIAGNOSIS — R197 Diarrhea, unspecified: Secondary | ICD-10-CM | POA: Diagnosis not present

## 2021-09-05 MED ORDER — ONDANSETRON HCL 4 MG PO TABS: 4.0000 mg | ORAL_TABLET | Freq: Three times a day (TID) | ORAL | 0 refills | Status: DC | PRN

## 2021-09-05 NOTE — Progress Notes (Signed)
E-Visit for Vomiting  We are sorry that you are not feeling well. Here is how we plan to help!  Based on what you have shared with me it looks like you have a Virus that is irritating your GI tract.  Vomiting is the forceful emptying of a portion of the stomach's content through the mouth.  Although nausea and vomiting can make you feel miserable, it's important to remember that these are not diseases, but rather symptoms of an underlying illness.  When we treat short term symptoms, we always caution that any symptoms that persist should be fully evaluated in a medical office.  I have prescribed a medication that will help alleviate your symptoms and allow you to stay hydrated:  Zofran 4 mg 1 tablet every 8 hours as needed for nausea and vomiting  HOME CARE: Drink clear liquids.  This is very important! Dehydration (the lack of fluid) can lead to a serious complication.  Start off with 1 tablespoon every 5 minutes for 8 hours. You may begin eating bland foods after 8 hours without vomiting.  Start with saltine crackers, white bread, rice, mashed potatoes, applesauce. After 48 hours on a bland diet, you may resume a normal diet. Try to go to sleep.  Sleep often empties the stomach and relieves the need to vomit.  GET HELP RIGHT AWAY IF:  Your symptoms do not improve or worsen within 2 days after treatment. You have a fever for over 3 days. You cannot keep down fluids after trying the medication.  MAKE SURE YOU:  Understand these instructions. Will watch your condition. Will get help right away if you are not doing well or get worse.   Thank you for choosing an e-visit.  Your e-visit answers were reviewed by a board certified advanced clinical practitioner to complete your personal care plan. Depending upon the condition, your plan could have included both over the counter or prescription medications.  Please review your pharmacy choice. Make sure the pharmacy is open so you can pick  up prescription now. If there is a problem, you may contact your provider through MyChart messaging and have the prescription routed to another pharmacy.  Your safety is important to us. If you have drug allergies check your prescription carefully.   For the next 24 hours you can use MyChart to ask questions about today's visit, request a non-urgent call back, or ask for a work or school excuse. You will get an email in the next two days asking about your experience. I hope that your e-visit has been valuable and will speed your recovery.   Approximately 5 minutes was spent documenting and reviewing patient's chart.    

## 2021-10-10 ENCOUNTER — Telehealth: Payer: Medicaid Other | Admitting: Physician Assistant

## 2021-10-10 DIAGNOSIS — J020 Streptococcal pharyngitis: Secondary | ICD-10-CM

## 2021-10-10 MED ORDER — AMOXICILLIN 500 MG PO TABS: 500.0000 mg | ORAL_TABLET | Freq: Two times a day (BID) | ORAL | 0 refills | Status: DC

## 2021-10-10 NOTE — Progress Notes (Signed)

## 2021-10-10 NOTE — Progress Notes (Signed)
I have spent 5 minutes in review of e-visit questionnaire, review and updating patient chart, medical decision making and response to patient.   Makaiya Geerdes Cody Makenly Larabee, PA-C    

## 2021-11-12 ENCOUNTER — Telehealth: Payer: Medicaid Other | Admitting: Nurse Practitioner

## 2021-11-12 DIAGNOSIS — R0602 Shortness of breath: Secondary | ICD-10-CM

## 2021-11-12 DIAGNOSIS — Z8709 Personal history of other diseases of the respiratory system: Secondary | ICD-10-CM

## 2021-11-12 MED ORDER — ALBUTEROL SULFATE HFA 108 (90 BASE) MCG/ACT IN AERS: 2.0000 | INHALATION_SPRAY | Freq: Four times a day (QID) | RESPIRATORY_TRACT | 0 refills | Status: AC | PRN

## 2021-11-12 MED ORDER — PREDNISONE 20 MG PO TABS: 40.0000 mg | ORAL_TABLET | Freq: Every day | ORAL | 0 refills | Status: AC

## 2021-11-12 NOTE — Progress Notes (Signed)
Visit for Asthma ° °Based on what you have shared with me, it looks like you may have a flare up of your asthma.  Asthma is a chronic (ongoing) lung disease which results in airway obstruction, inflammation and hyper-responsiveness.  ° °Asthma symptoms vary from person to person, with common symptoms including nighttime awakening and decreased ability to participate in normal activities as a result of shortness of breath. It is often triggered by changes in weather, changes in the season, changes in air temperature, or inside (home, school, daycare or work) allergens such as animal dander, mold, mildew, woodstoves or cockroaches.   It can also be triggered by hormonal changes, extreme emotion, physical exertion or an upper respiratory tract illness.    ° °It is important to identify the trigger, and then eliminate or avoid the trigger if possible.  ° °If you have been prescribed medications to be taken on a regular basis, it is important to follow the asthma action plan and to follow guidelines to adjust medication in response to increasing symptoms of decreased peak expiratory flow rate ° °Treatment: °I have prescribed: Albuterol (Proventil HFA; Ventolin HFA) 108 (90 Base) MCG/ACT Inhaler 2 puffs into the lungs every six hours as needed for wheezing or shortness of breath and Prednisone 40mg by mouth per day for 5 - 7 days ° °HOME CARE °Only take medications as instructed by your medical team. °Consider wearing a mask or scarf to improve breathing air temperature have been shown to decrease irritation and decrease exacerbations °Get rest. °Taking a steamy shower or using a humidifier may help nasal congestion sand ease sore throat pain. You can place a towel over your head and breathe in the steam from hot water coming from a faucet. °Using a saline nasal spray works much the same way.  °Cough drops, hare  candies and sore throat lozenges may ease your cough.  °Avoid close contacts especially the very you and the elderly °Cover your mouth if you cough or sneeze °Always remember to wash your hands.  ° ° °GET HELP RIGHT AWAY IF: °You develop worsening symptoms; breathlessness at rest, drowsy, confused or agitated, unable to speak in full sentences °You have coughing fits °You develop a severe headache or visual changes °You develop shortness of breath, difficulty breathing or start having chest pain °Your symptoms persist after you have completed your treatment plan °If your symptoms do not improve within 10 days ° °MAKE SURE YOU °Understand these instructions. °Will watch your condition. °Will get help right away if you are not doing well or get worse.  ° °Your e-visit answers were reviewed by a board certified advanced clinical practitioner to complete your personal care plan, Depending upon the condition, your plan could have included both over the counter or prescription medications.  ° °Please review your pharmacy choice. °Your safety is important to us. If you have drug allergies check your prescription carefully. ° °You can use MyChart to ask questions about today's visit, request a non-urgent  °call back, or ask for a work or school excuse for 24 hours related to this e-Visit. If it has been greater than 24 hours you will need to follow up with your provider, or enter a new e-Visit to address those concerns.  ° °You will get an e-mail in the next two days asking about your experience. I hope that your e-visit has been valuable and will speed your recovery. Thank you for using e-visits.  ° °I have spent at least 5 minutes   reviewing and documenting in the patient's chart.  °

## 2021-11-14 DIAGNOSIS — R509 Fever, unspecified: Secondary | ICD-10-CM | POA: Diagnosis not present

## 2021-11-14 DIAGNOSIS — U071 COVID-19: Secondary | ICD-10-CM | POA: Diagnosis not present

## 2022-04-02 ENCOUNTER — Ambulatory Visit: Payer: Medicaid Other | Admitting: Plastic Surgery

## 2022-04-02 ENCOUNTER — Encounter: Payer: Self-pay | Admitting: Plastic Surgery

## 2022-04-02 VITALS — BP 152/70 | HR 83 | Ht 67.0 in | Wt 303.4 lb

## 2022-04-02 DIAGNOSIS — L910 Hypertrophic scar: Secondary | ICD-10-CM

## 2022-04-02 NOTE — Progress Notes (Incomplete)
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  The keloid has been there for several years and has slowly increased in size.  Past/Anticipated interventions by patient's surgeon/dermatologist for current problematic lesion, if any:  Dr. Lovena Le 04/02/2022 -Patient has a keloid approximately 1.5 cm in size on the helix of the left ear. Can excise this in the office under local.  -2 options to decrease the risk of recurrence are injecting the site with steroids and low-dose radiation therapy.  -In my experience the low-dose radiation therapy results and a lower chance of recurrence. She is agreeable to consulting with the radiation oncologist regarding radiation therapy.    SAFETY ISSUES: Prior radiation? {:18581} Pacemaker/ICD? {:18581} Possible current pregnancy? {:18581} Is the patient on methotrexate? {:18581}  Current Complaints / other details:  ***

## 2022-04-02 NOTE — Progress Notes (Signed)
   Referring Provider No referring provider defined for this encounter.   CC:  Chief Complaint  Patient presents with   Advice Only      Brooke Wall is an 22 y.o. female.  HPI: Brooke Wall presents today for evaluation for a keloid on her left ear resulting from an ear piercing..  The keloid has been there for several years and has slowly increased in size.  It does not bother her other than being occasionally uncomfortable and itching.  Would like to have it removed.  No Known Allergies  Outpatient Encounter Medications as of 04/02/2022  Medication Sig   albuterol (VENTOLIN HFA) 108 (90 Base) MCG/ACT inhaler Inhale 2 puffs into the lungs every 6 (six) hours as needed for wheezing or shortness of breath.   [DISCONTINUED] amoxicillin (AMOXIL) 500 MG tablet Take 1 tablet (500 mg total) by mouth 2 (two) times daily. (Patient not taking: Reported on 04/02/2022)   [DISCONTINUED] methylPREDNISolone (MEDROL DOSEPAK) 4 MG TBPK tablet Take following package directions. (Patient not taking: Reported on 04/02/2022)   [DISCONTINUED] ondansetron (ZOFRAN) 4 MG tablet Take 1 tablet (4 mg total) by mouth every 8 (eight) hours as needed for nausea or vomiting. (Patient not taking: Reported on 04/02/2022)   No facility-administered encounter medications on file as of 04/02/2022.     Past Medical History:  Diagnosis Date   Arthritis     Past Surgical History:  Procedure Laterality Date   WISDOM TOOTH EXTRACTION      No family history on file.  Social History   Social History Narrative   Not on file     Review of Systems General: Denies fevers, chills, weight loss CV: Denies chest pain, shortness of breath, palpitations Skin: Soft tissue mass left ear  Physical Exam    04/02/2022   11:00 AM 10/12/2020    3:34 AM 10/12/2020   12:37 AM  Vitals with BMI  Height 5\' 7"   5\' 7"   Weight 303 lbs 6 oz  220 lbs  BMI 52.77  82.42  Systolic 353 614   Diastolic 70 71   Pulse 83 78     General:   No acute distress,  Alert and oriented, Non-Toxic, Normal speech and affect Integument: Soft tissue mass consistent with a keloid. Mammogram: Patient is 59 has not begun mammographic screening Assessment/Plan Keloid: Patient has a keloid approximately 1.5 cm in size on the helix of the left ear.  Can excise this in the office under local.  We discussed the high risk of recurrence if nothing else was done other than the excision.  2 options to decrease the risk of recurrence are injecting the site with steroids and low-dose radiation therapy.  In my experience the low-dose radiation therapy results and a lower chance of recurrence.  Brooke Wall is agreeable to consulting with the radiation oncologist regarding radiation therapy.  Will place a consult then schedule her for excision after Brooke Wall is finished her appointment.  Camillia Herter 04/02/2022, 11:09 AM

## 2022-04-03 ENCOUNTER — Telehealth: Payer: Self-pay | Admitting: Radiation Oncology

## 2022-04-03 ENCOUNTER — Ambulatory Visit: Payer: Medicaid Other

## 2022-04-03 ENCOUNTER — Ambulatory Visit: Payer: Medicaid Other | Admitting: Radiation Oncology

## 2022-04-03 NOTE — Telephone Encounter (Signed)
3/12 @ 10:40 am patient no show/reschedule patient -per Blenda Nicely, RN.  Left voicemail with patient to call our office to be reschedule for missed appointment from today.  Also called patient's grandmother # no answer/hang up could not leave voicemail.

## 2022-04-04 ENCOUNTER — Telehealth: Payer: Self-pay | Admitting: Radiation Oncology

## 2022-04-04 NOTE — Telephone Encounter (Signed)
3/13 @ 9:13 am Left voicemail for patient to call our office to be reschedule for missed appointment on 3/12.

## 2022-04-05 ENCOUNTER — Ambulatory Visit
Admission: RE | Admit: 2022-04-05 | Discharge: 2022-04-05 | Disposition: A | Discharge status: Home / Self Care | Payer: Medicaid Other | Source: Ambulatory Visit | Attending: Radiation Oncology | Admitting: Radiation Oncology

## 2022-04-05 ENCOUNTER — Encounter: Payer: Self-pay | Admitting: Radiation Oncology

## 2022-04-05 ENCOUNTER — Encounter: Payer: Self-pay | Admitting: *Deleted

## 2022-04-05 VITALS — Ht 67.0 in | Wt 303.0 lb

## 2022-04-05 DIAGNOSIS — L910 Hypertrophic scar: Secondary | ICD-10-CM

## 2022-04-05 NOTE — Progress Notes (Signed)
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  The keloid has been there for several years and has slowly increased in size.  Denies pain or irritation to the left ear.    Past/Anticipated interventions by patient's surgeon/dermatologist for current problematic lesion, if any: Dr. Lovena Le 04/02/2022 -Patient has a keloid approximately 1.5 cm in size on the helix of the left ear. Can excise this in the office under local. -2 options to decrease the risk of recurrence are injecting the site with steroids and low-dose radiation therapy. -In my experience the low-dose radiation therapy results and a lower chance of recurrence. She is agreeable to consulting with the radiation oncologist regarding radiation therapy. -Surgery date- unscheduled at this time.    SAFETY ISSUES: Prior radiation? No Pacemaker/ICD? No Possible current pregnancy? Having Cycles, no birth control, she is not sexually active. Is the patient on methotrexate? No    Current Complaints / other details:

## 2022-04-05 NOTE — Progress Notes (Signed)
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 A diagnosis of Keloid of skin was also pertinent to this visit.   HISTORY OF PRESENT ILLNESS: Brooke Wall is a 22 y.o. female seen at the request of Dr. Lovena Wall for a diagnosis of a keloid of the left pinna of the ear. The patient reports that she has had ear piercing as a little girl and had small keloids bilaterally of her earlobes that after taking out her earrings developed but over time have resolved. She had a cartilage piercing in 2019 of her left pinna, and since that time noticed a thickening of her posterior left ear. She was seen by Dr. Lovena Wall and is seen to consider adjuvant radiation after surgical resection. She has also been offered steroid injection as well.Marland Kitchen     PREVIOUS RADIATION THERAPY: No   PAST MEDICAL HISTORY:  Past Medical History:  Diagnosis Date   Arthritis        PAST SURGICAL HISTORY: Past Surgical History:  Procedure Laterality Date   WISDOM TOOTH EXTRACTION       FAMILY HISTORY: History reviewed. No pertinent family history.   SOCIAL HISTORY:  reports that she has never smoked. She has never used smokeless tobacco. She reports that she does not drink alcohol and does not use drugs.   ALLERGIES: Patient has no known allergies.   MEDICATIONS:  Current Outpatient Medications  Medication Sig Dispense Refill   albuterol (VENTOLIN HFA) 108 (90 Base) MCG/ACT inhaler Inhale 2 puffs into the lungs every 6 (six) hours as needed for wheezing or shortness of breath. 8 g 0   No current facility-administered medications for this encounter.     REVIEW OF SYSTEMS: On review of systems, the patient reports that she is doing well and  reports occasionally in the recent past her keloid would leak fluid, but it is not painful, itchy, or disruptive physically, but she does desire removal for cosmetic reasons. No other sites of keloid scars excist. No other complaints are noted.     PHYSICAL EXAM:  Wt Readings from Last 3 Encounters:  04/05/22 (!) 303 lb (137.4 kg)  04/02/22 (!) 303 lb 6.4 oz (137.6 kg)  10/12/20 220 lb (99.8 kg)  Other vital signs are not noted due to encounter type, weight reported by pt.  Pain Assessment Pain Score: 0-No pain/10  In general this is a well appearing African American female in no acute distress. She's alert and oriented x4 and appropriate throughout the examination. Cardiopulmonary assessment is negative for acute distress and she exhibits normal effort. She has a posterior ear pinna Keloid in the left ear that is about 3 cm in size.     ECOG = 0  0 - Asymptomatic (Fully active, able to carry on all predisease activities without restriction)  1 - Symptomatic but completely ambulatory (Restricted in physically strenuous activity but ambulatory and able to carry out work of a light or sedentary nature. For example, light housework, office work)  2 - Symptomatic, <50% in bed during the day (Ambulatory and capable of all self care but unable to carry out any work activities. Up and about more than 50% of waking hours)  3 - Symptomatic, >50% in bed, but not bedbound (Capable of only limited self-care, confined to bed or chair 50% or more of waking hours)  4 - Bedbound (Completely disabled. Cannot carry on any self-care. Totally confined to bed or chair)  5 - Death   Eustace Pen MM, Creech RH, Tormey DC, et al. [phone]). "Toxicity and response criteria of the [facility]". [place] Oncol. 5 (6): 649-55    LABORATORY DATA:  Lab Results  Component Value Date   WBC 11.1 (H) 10/12/2020   HGB 10.0 (L) 10/12/2020   HCT 34.1 (L) 10/12/2020   MCV 62.7 (L) 10/12/2020    PLT 486 (H) 10/12/2020   Lab Results  Component Value Date   NA 137 10/12/2020   K 4.3 10/12/2020   CL 103 10/12/2020   CO2 26 10/12/2020   Lab Results  Component Value Date   ALT 14 01/23/2019   AST 16 01/23/2019   ALKPHOS 55 01/23/2019   BILITOT 0.5 01/23/2019      RADIOGRAPHY: No results found.     IMPRESSION/PLAN: 1. Keloid of the pinna of the left ear. Dr. Lisbeth Wall discusses the patient's diagnosis of keloid and course of the process to date. He reviews the options of steroid injections, versus surgical resection, versus surgical resection and adjuvant radiotherapy. We discussed the risks, benefits, short, and long term effects of radiotherapy, as well as the curative intent, and the patient is interested in proceeding. Dr. Lisbeth Wall discusses the delivery and logistics of radiotherapy and anticipates a course of 4 fractions of radiotherapy. She will sign written consent to proceed at the time of planning therapy with clinical set up. 2. Contraceptive counseling. We will review this closer to the time of treatment that she should avoid pregnancy during radiation.    This encounter was provided by telemedicine platform MyChart.  The patient has provided two factor identification and has given verbal consent for this type of encounter and has been advised to only accept a meeting of this type in a secure network environment. The time spent during this encounter was 45 minutes including preparation, discussion, and coordination of the patient's care. The attendants for this meeting include Brooke Nicely, RN, Dr. Lisbeth Wall, Brooke Wall  and Brooke Wall.  During the encounter,  Brooke Nicely, RN, Dr. Lisbeth Wall, and Brooke Wall were located at [facility] Radiation Oncology Department.  Brooke Wall was located at home.    The above documentation reflects my direct findings during this shared patient visit. Please see the separate note by Dr. Lisbeth Wall on this date  for the remainder of the patient's plan of care.    Brooke Wall, Brooke Wall   **Disclaimer: This note was dictated with voice recognition software. Similar sounding words can inadvertently be transcribed and this note may contain transcription errors which may not have been corrected upon publication of note.**

## 2022-04-05 NOTE — Telephone Encounter (Signed)
Left a voicemail and send patient a mychart message requesting a photo of her left ear.  Brooke Wall. [name], BSN

## 2022-04-09 NOTE — Progress Notes (Signed)
Addendum: Below is a photograph of the keloid in the left pinna.        Carola Rhine, PAC

## 2022-04-09 NOTE — Addendum Note (Signed)
Encounter addended by: Hayden Pedro, PA-C on: 04/09/2022 9:20 AM  Actions taken: Clinical Note Signed

## 2022-05-10 ENCOUNTER — Ambulatory Visit: Payer: Medicaid Other

## 2022-05-10 ENCOUNTER — Ambulatory Visit: Payer: Medicaid Other | Admitting: Radiation Oncology

## 2022-05-10 ENCOUNTER — Ambulatory Visit: Payer: Medicaid Other | Admitting: Plastic Surgery

## 2022-05-10 DIAGNOSIS — L910 Hypertrophic scar: Secondary | ICD-10-CM | POA: Diagnosis not present

## 2022-05-11 ENCOUNTER — Ambulatory Visit: Payer: Medicaid Other

## 2022-05-14 ENCOUNTER — Ambulatory Visit: Payer: Medicaid Other

## 2022-05-15 ENCOUNTER — Ambulatory Visit: Payer: Medicaid Other

## 2022-05-16 ENCOUNTER — Ambulatory Visit: Payer: Medicaid Other

## 2022-05-29 ENCOUNTER — Ambulatory Visit: Payer: Medicaid Other

## 2022-06-04 ENCOUNTER — Ambulatory Visit: Payer: Medicaid Other

## 2022-06-10 ENCOUNTER — Ambulatory Visit: Payer: Medicaid Other

## 2022-06-11 ENCOUNTER — Ambulatory Visit: Payer: Medicaid Other

## 2022-06-12 ENCOUNTER — Telehealth: Payer: Self-pay | Admitting: Radiation Oncology

## 2022-06-12 ENCOUNTER — Ambulatory Visit: Payer: Medicaid Other

## 2022-06-12 NOTE — Telephone Encounter (Signed)
5/21 @ 12:06 pm Patient called with concerns about why her treatment appointment was cancel for 5/23.  She stated she thought she had appt on this Thursday.  She would like a called back at [phone].  Called L2 machine no answer, sent email to L2/L3 machine copied Jill Side and Glee Arvin so they are aware.

## 2022-06-12 NOTE — Telephone Encounter (Signed)
5/21 @ 12:40 pm called patient back as requested -per Jill Side.  She is aware of why her treatment appt was cancel on 5/23, due to late surgery on that same day, no available appts after 3:00 pm.  Treatment appt reschedule for 5/24 Friday morning at 9:30 am.  Patient is aware and stated will be here.

## 2022-06-13 ENCOUNTER — Ambulatory Visit: Payer: Medicaid Other

## 2022-06-14 ENCOUNTER — Ambulatory Visit: Payer: Medicaid Other | Admitting: Radiation Oncology

## 2022-06-14 ENCOUNTER — Encounter: Payer: Self-pay | Admitting: Plastic Surgery

## 2022-06-14 ENCOUNTER — Ambulatory Visit (INDEPENDENT_AMBULATORY_CARE_PROVIDER_SITE_OTHER): Payer: Medicaid Other | Admitting: Plastic Surgery

## 2022-06-14 ENCOUNTER — Other Ambulatory Visit: Payer: Self-pay | Admitting: Plastic Surgery

## 2022-06-14 VITALS — BP 134/89 | HR 113

## 2022-06-14 DIAGNOSIS — L910 Hypertrophic scar: Secondary | ICD-10-CM

## 2022-06-14 NOTE — Progress Notes (Signed)
Procedure Note  Preoperative Dx: Keloid left ear  Postoperative Dx: Same  Procedure: Excision of keloid  Anesthesia: Lidocaine 1% with 1:100,000 epinephrine and 0.25% Sensorcaine   Indication for Procedure: Removal because of pain and itching.  Description of Procedure: Risks and complications were explained to the patient including recurrence.  Consent was confirmed and the patient understands the risks and benefits.  The potential complications and alternatives were explained and the patient consents.  The patient expressed understanding the option of not having the procedure and the risks of a scar.  Time out was called and all information was confirmed to be correct.    The area was prepped and drapped.  Local anesthetic was injected in the subcutaneous tissues.  After waiting for the local to take affect the keloid was excised leaving a small cuff of skin.  After obtaining hemostasis, the surgical wound was closed with interrupted 5-0 Prolene sutures.  The surgical wound and keloid measured 2 cm.  A dressing was applied.  The patient was given instructions on how to care for the area and a follow up appointment.  She also has radiation therapy scheduled beginning tomorrow morning.  Brooke Wall tolerated the procedure well and there were no complications. The specimen was sent to pathology.

## 2022-06-15 ENCOUNTER — Ambulatory Visit: Payer: Medicaid Other

## 2022-06-15 ENCOUNTER — Ambulatory Visit
Admission: RE | Admit: 2022-06-15 | Discharge: 2022-06-15 | Disposition: A | Discharge status: Home / Self Care | Payer: Medicaid Other | Source: Ambulatory Visit | Attending: Radiation Oncology | Admitting: Radiation Oncology

## 2022-06-15 ENCOUNTER — Other Ambulatory Visit: Payer: Self-pay

## 2022-06-15 DIAGNOSIS — Z510 Encounter for antineoplastic radiation therapy: Secondary | ICD-10-CM | POA: Diagnosis present

## 2022-06-15 DIAGNOSIS — L910 Hypertrophic scar: Secondary | ICD-10-CM | POA: Insufficient documentation

## 2022-06-15 LAB — RAD ONC ARIA SESSION SUMMARY
Course Elapsed Days: 0
Plan Fractions Treated to Date: 1
Plan Prescribed Dose Per Fraction: 4 Gy
Plan Total Fractions Prescribed: 3
Plan Total Prescribed Dose: 12 Gy
Reference Point Dosage Given to Date: 4 Gy
Reference Point Session Dosage Given: 4 Gy
Session Number: 1

## 2022-06-18 ENCOUNTER — Ambulatory Visit: Payer: Medicaid Other

## 2022-06-18 IMAGING — CR DG CHEST 2V
2 series · 2 of 2 positions shown · non-contrast
Comparison: 01/23/2019

CLINICAL DATA: Shortness of breath

EXAM:
CHEST - 2 VIEW

[chest pa]
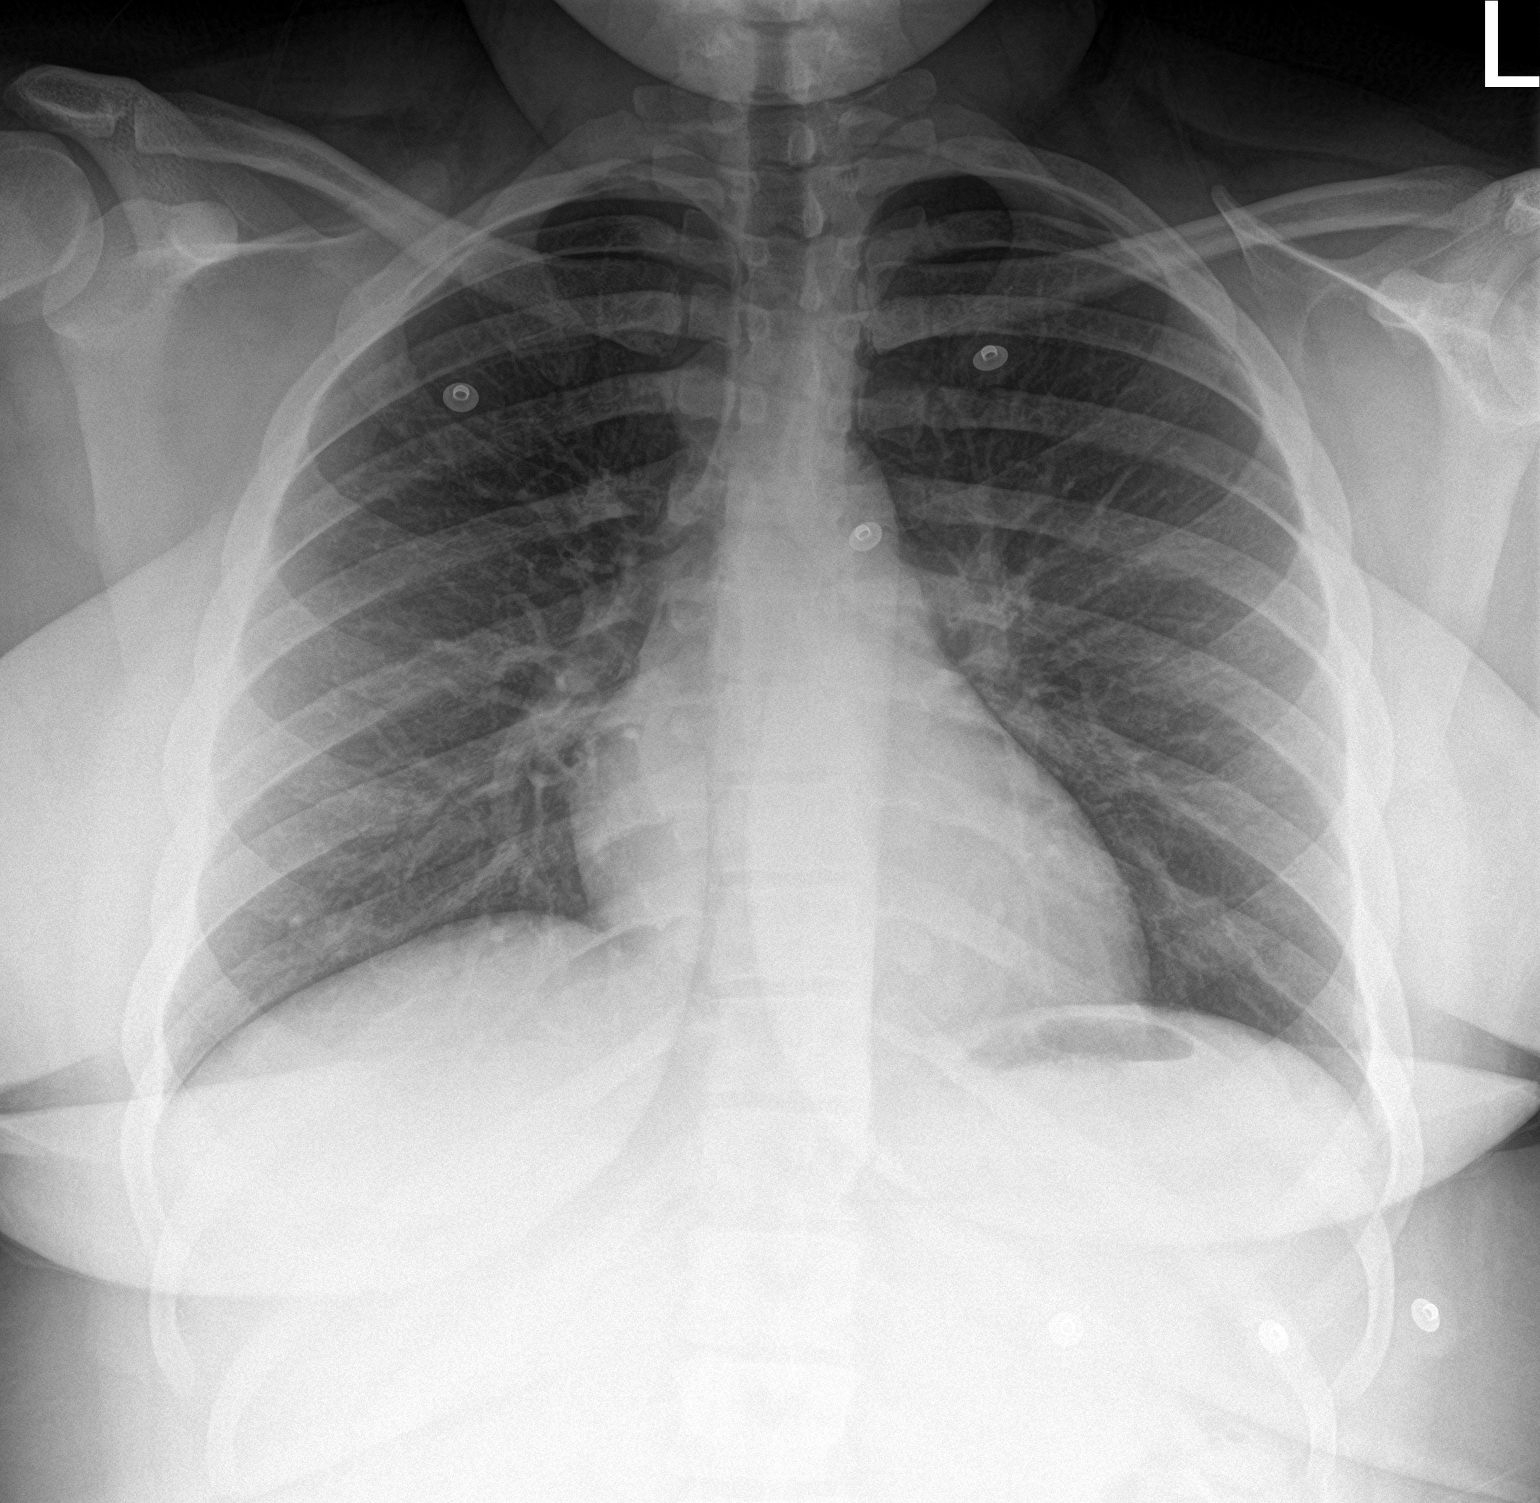

[chest lat]
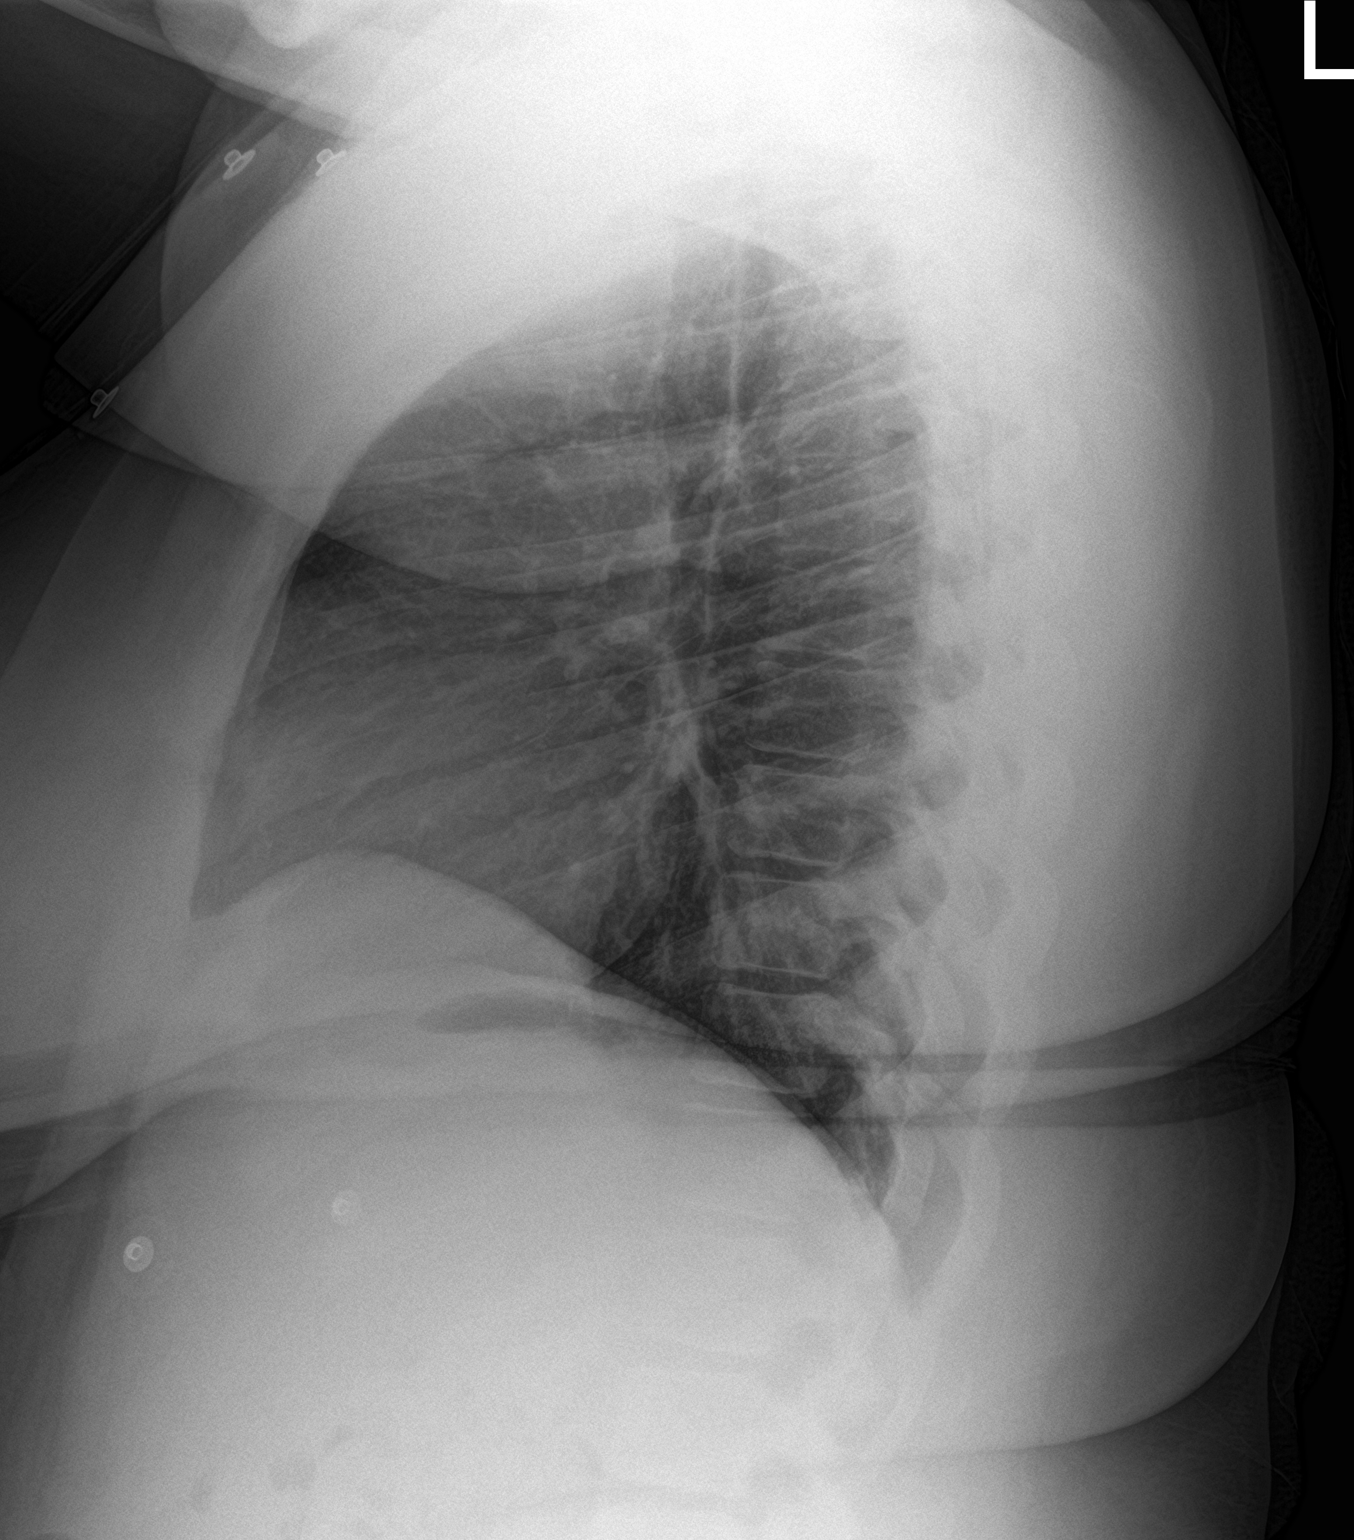

[2 of 2 positions shown; findings below may reference images not displayed]

FINDINGS: The heart size and mediastinal contours are within normal limits.
Both lungs are clear. The visualized skeletal structures are
unremarkable.
IMPRESSION: No active cardiopulmonary disease.

## 2022-06-19 ENCOUNTER — Ambulatory Visit: Payer: Medicaid Other

## 2022-06-19 ENCOUNTER — Other Ambulatory Visit: Payer: Self-pay

## 2022-06-19 ENCOUNTER — Ambulatory Visit
Admission: RE | Admit: 2022-06-19 | Discharge: 2022-06-19 | Disposition: A | Discharge status: Home / Self Care | Payer: Medicaid Other | Source: Ambulatory Visit | Attending: Radiation Oncology | Admitting: Radiation Oncology

## 2022-06-19 DIAGNOSIS — Z510 Encounter for antineoplastic radiation therapy: Secondary | ICD-10-CM | POA: Diagnosis not present

## 2022-06-19 LAB — RAD ONC ARIA SESSION SUMMARY
Course Elapsed Days: 4
Plan Fractions Treated to Date: 2
Plan Prescribed Dose Per Fraction: 4 Gy
Plan Total Fractions Prescribed: 3
Plan Total Prescribed Dose: 12 Gy
Reference Point Dosage Given to Date: 8 Gy
Reference Point Session Dosage Given: 4 Gy
Session Number: 2

## 2022-06-20 ENCOUNTER — Ambulatory Visit: Payer: Medicaid Other

## 2022-06-20 ENCOUNTER — Other Ambulatory Visit: Payer: Self-pay

## 2022-06-20 ENCOUNTER — Ambulatory Visit
Admission: RE | Admit: 2022-06-20 | Discharge: 2022-06-20 | Disposition: A | Discharge status: Home / Self Care | Payer: Medicaid Other | Source: Ambulatory Visit | Attending: Radiation Oncology | Admitting: Radiation Oncology

## 2022-06-20 ENCOUNTER — Ambulatory Visit (INDEPENDENT_AMBULATORY_CARE_PROVIDER_SITE_OTHER): Payer: Medicaid Other | Admitting: Student

## 2022-06-20 DIAGNOSIS — Z510 Encounter for antineoplastic radiation therapy: Secondary | ICD-10-CM | POA: Diagnosis not present

## 2022-06-20 DIAGNOSIS — L910 Hypertrophic scar: Secondary | ICD-10-CM

## 2022-06-20 LAB — RAD ONC ARIA SESSION SUMMARY
Course Elapsed Days: 5
Plan Fractions Treated to Date: 3
Plan Prescribed Dose Per Fraction: 4 Gy
Plan Total Fractions Prescribed: 3
Plan Total Prescribed Dose: 12 Gy
Reference Point Dosage Given to Date: 12 Gy
Reference Point Session Dosage Given: 4 Gy
Session Number: 3

## 2022-06-20 NOTE — Progress Notes (Signed)
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  Patient underwent excision of keloid with Dr. Ladona Ridgel on 06/14/2022.  During the procedure, the keloid was excised and the surgical wound was closed with interrupted 5-0 Prolene sutures.  Patient also had radiation therapy scheduled for the following morning.  Specimen was sent to pathology.  Pathology has not yet resulted.  Patient presents to the clinic today for postprocedural follow-up.  Today, patient reports she is doing well.  She denies any fevers or chills.  She denies any drainage from the wound.  She states the procedure site feels little bumpy.  She denies any other concerns or complaints.  She reports she has been receiving radiation and today will be her last visit.  On exam, patient is sitting upright in no acute distress.  Incision is intact with Prolene sutures.  There is no swelling or drainage.  There is no erythema.  There is some very mild tenderness to palpation.  There are no signs of infection on exam.   Prolene sutures were removed without difficulty.  Patient tolerated well.  I discussed with the patient that it appears her incision is healing well.  I discussed with her that I would like her to massage her incision gently daily.  I recommended that she apply Vaseline to the incision for the next week or so, and then transition to a silicone-based scar cream or silicone tapes after that.  Patient expressed understanding.  I discussed with the patient that I would like her to avoid direct sunlight to the incision as this can worsen the scar.  I recommended she wear hat or cover the area with sunscreen if she is going to be on the sunlight.  Patient expressed understanding.  Patient to follow back up in 4 to 6 weeks for reevaluation.  I instructed the patient to call in the meantime if she has any questions or concerns about anything.

## 2022-06-21 ENCOUNTER — Ambulatory Visit: Payer: Medicaid Other

## 2022-06-26 NOTE — Radiation Completion Notes (Addendum)
  Radiation Oncology         [phone] ________________________________  Name: Brooke Wall MRN: [id number]  Date of Service: 06/20/2022  DOB: Mar 31, 2000  End of Treatment Note  Diagnosis: Keloid scar of the left ear  Intent: Curative     ==========DELIVERED PLANS==========  First Treatment Date: 2022-06-15 - Last Treatment Date: 2022-06-20   Plan Name: [id number] Site: [facility], Left Technique: Electron Mode: Electron Dose Per Fraction: 4 Gy Prescribed Dose (Delivered / Prescribed): 12 Gy / 12 Gy Prescribed Fxs (Delivered / Prescribed): 3 / 3     ==========ON TREATMENT VISIT DATES========== 2022-06-15, 2022-06-20    See weekly On Treatment Notes in Epic for details. The patient tolerated radiation.   The patient will receive a call in about one month from the radiation oncology department. She will continue follow up with Dr. Ladona Ridgel as well.      Osker Mason, PAC

## 2022-07-25 ENCOUNTER — Ambulatory Visit: Payer: Medicaid Other | Admitting: Plastic Surgery

## 2022-08-03 DIAGNOSIS — Z1231 Encounter for screening mammogram for malignant neoplasm of breast: Secondary | ICD-10-CM

## 2022-08-10 ENCOUNTER — Ambulatory Visit: Payer: Medicaid Other | Admitting: Family Medicine

## 2022-08-27 ENCOUNTER — Ambulatory Visit
Admission: RE | Admit: 2022-08-27 | Discharge: 2022-08-27 | Disposition: A | Discharge status: Home / Self Care | Payer: Medicaid Other | Source: Ambulatory Visit | Attending: Radiation Oncology | Admitting: Radiation Oncology

## 2022-08-27 ENCOUNTER — Ambulatory Visit: Payer: Medicaid Other | Admitting: Family Medicine

## 2022-08-27 NOTE — Progress Notes (Signed)
  Radiation Oncology         [phone]) [phone] ________________________________  Name: Brooke Wall MRN: [id number]  Date of Service: 08/27/2022  DOB: 10-Apr-2000  Post Treatment Telephone Note  Diagnosis:  Keloid scar of the left ear (as documented in provider EOT note)  The patient was not available for call today.    The patient has not scheduled a follow up with her medical oncologist Dr. Ladona Ridgel  for ongoing surveillance, and with Dr. Basilio Cairo. The patient was encouraged to call if she develops concerns or questions regarding radiation.    Ruel Favors, LPN

## 2022-09-04 ENCOUNTER — Ambulatory Visit (INDEPENDENT_AMBULATORY_CARE_PROVIDER_SITE_OTHER): Payer: Medicaid Other | Admitting: Family Medicine

## 2022-09-04 ENCOUNTER — Encounter: Payer: Self-pay | Admitting: Family Medicine

## 2022-09-04 VITALS — BP 124/80 | HR 85 | Temp 98.3°F | Ht 67.25 in | Wt 302.1 lb

## 2022-09-04 DIAGNOSIS — F321 Major depressive disorder, single episode, moderate: Secondary | ICD-10-CM | POA: Diagnosis not present

## 2022-09-04 DIAGNOSIS — R400 Somnolence: Secondary | ICD-10-CM | POA: Diagnosis not present

## 2022-09-04 DIAGNOSIS — Z114 Encounter for screening for human immunodeficiency virus [HIV]: Secondary | ICD-10-CM

## 2022-09-04 DIAGNOSIS — N926 Irregular menstruation, unspecified: Secondary | ICD-10-CM | POA: Diagnosis not present

## 2022-09-04 DIAGNOSIS — L678 Other hair color and hair shaft abnormalities: Secondary | ICD-10-CM | POA: Diagnosis not present

## 2022-09-04 DIAGNOSIS — E559 Vitamin D deficiency, unspecified: Secondary | ICD-10-CM

## 2022-09-04 DIAGNOSIS — Z1159 Encounter for screening for other viral diseases: Secondary | ICD-10-CM | POA: Diagnosis not present

## 2022-09-04 LAB — CBC
HCT: 33.1 % — ABNORMAL LOW (ref 36.0–46.0)
Hemoglobin: 10.1 g/dL — ABNORMAL LOW (ref 12.0–15.0)
MCHC: 30.5 g/dL (ref 30.0–36.0)
MCV: 61.5 fl — ABNORMAL LOW (ref 78.0–100.0)
Platelets: 512 10*3/uL — ABNORMAL HIGH (ref 150.0–400.0)
RBC: 5.38 Mil/uL — ABNORMAL HIGH (ref 3.87–5.11)
RDW: 18.9 % — ABNORMAL HIGH (ref 11.5–15.5)
WBC: 8.1 10*3/uL (ref 4.0–10.5)

## 2022-09-04 LAB — VITAMIN D 25 HYDROXY (VIT D DEFICIENCY, FRACTURES): VITD: 13.08 ng/mL — ABNORMAL LOW (ref 30.00–100.00)

## 2022-09-04 LAB — TSH: TSH: 2.26 u[IU]/mL (ref 0.35–5.50)

## 2022-09-04 LAB — COMPREHENSIVE METABOLIC PANEL
ALT: 11 U/L (ref 0–35)
AST: 12 U/L (ref 0–37)
Albumin: 4.1 g/dL (ref 3.5–5.2)
Alkaline Phosphatase: 70 U/L (ref 39–117)
BUN: 8 mg/dL (ref 6–23)
CO2: 26 mEq/L (ref 19–32)
Calcium: 9.3 mg/dL (ref 8.4–10.5)
Chloride: 104 mEq/L (ref 96–112)
Creatinine, Ser: 0.87 mg/dL (ref 0.40–1.20)
GFR: 93.35 mL/min (ref >60.00)
Glucose, Bld: 87 mg/dL (ref 70–99)
Potassium: 3.5 mEq/L (ref 3.5–5.1)
Sodium: 140 mEq/L (ref 135–145)
Total Bilirubin: 0.2 mg/dL (ref 0.2–1.2)
Total Protein: 7.2 g/dL (ref 6.0–8.3)

## 2022-09-04 LAB — HEMOGLOBIN A1C: Hgb A1c MFr Bld: 6.2 % (ref 4.6–6.5)

## 2022-09-04 LAB — TESTOSTERONE: Testosterone: 52.18 ng/dL — ABNORMAL HIGH (ref 15.00–40.00)

## 2022-09-04 MED ORDER — VENLAFAXINE HCL ER 75 MG PO CP24
75.0000 mg | ORAL_CAPSULE | Freq: Every day | ORAL | 2 refills | Status: DC
Start: 2022-09-04 — End: 2022-09-28

## 2022-09-04 NOTE — Progress Notes (Unsigned)
New Patient Office Visit  Subjective    Patient ID: Brooke Wall, female    DOB: 15-Jul-2000  Age: 22 y.o. MRN: [id number]  CC:  Chief Complaint  Patient presents with  . Establish Care    HPI Brooke Wall presents to establish care Patient states she thinks has PCOS, states that she has facial hair, states that it is hard to lose weight, she also reports a change in her periods, sometimes irregular, skipping months, changed in amount of bleeding,etc. Has not had any blood work. Patient is sexually active, states that she has never had a pap smear done.   Patient reports about a 1 year history of feelings of sadness and depression, states that she has had lot of deaths in the family in the last year.  Her grandmother, father, and her close family members. States that her sleep is very disordered, has a lot of trouble falling asleep at night, states that she has a lot of trouble "turning off" her brain at night. Has a lot of anxiety also, felt like her "heart was pounding" and she felt dizzy and lightheaded. States that she also snores at night a lot, sometimes will get so tired that she will sleep all day. States that her dad had OSA and she thinks she might have it as well.   Flowsheet Row Office Visit from 09/04/2022 in [facility] [place]  PHQ-9 Total Score 24       Current Outpatient Medications  Medication Instructions  . albuterol (VENTOLIN HFA) 108 (90 Base) MCG/ACT inhaler 2 puffs, Inhalation, Every 6 hours PRN  . venlafaxine XR (EFFEXOR XR) 75 mg, Oral, Daily with breakfast    Past Medical History:  Diagnosis Date  . Asthma     Past Surgical History:  Procedure Laterality Date  . KELOID EXCISION     left ear-2024  . WISDOM TOOTH EXTRACTION      Family History  Problem Relation Age of Onset  . Stroke Father   . Depression Father   . Alcohol abuse Father   . Heart attack Father   . Cancer Paternal Grandmother     Social History    Socioeconomic History  . Marital status: Single    Spouse name: Not on file  . Number of children: Not on file  . Years of education: Not on file  . Highest education level: Not on file  Occupational History  . Not on file  Tobacco Use  . Smoking status: Never  . Smokeless tobacco: Never  Vaping Use  . Vaping status: Every Day  Substance and Sexual Activity  . Alcohol use: Yes    Comment: socially  . Drug use: Never  . Sexual activity: Yes  Other Topics Concern  . Not on file  Social History Narrative  . Not on file   Social Determinants of Health   Financial Resource Strain: Not on file  Food Insecurity: Not on file  Transportation Needs: Not on file  Physical Activity: Not on file  Stress: Not on file  Social Connections: Not on file  Intimate Partner Violence: Not on file    Review of Systems  All other systems reviewed and are negative.       Objective    BP 124/80 (BP Location: Left Arm, Patient Position: Sitting, Cuff Size: Large)   Pulse 85   Temp 98.3 F (36.8 C) (Oral)   Ht 5' 7.25" (1.708 m)   Wt (!) 302 lb 1.6  oz (137 kg)   LMP 08/28/2022 (Approximate)   SpO2 98%   BMI 46.96 kg/m   Physical Exam Vitals reviewed.  Constitutional:      Appearance: Normal appearance. She is well-groomed. She is morbidly obese.  Eyes:     Conjunctiva/sclera: Conjunctivae normal.  Neck:     Thyroid: No thyromegaly.  Cardiovascular:     Rate and Rhythm: Normal rate and regular rhythm.     Pulses: Normal pulses.     Heart sounds: S1 normal and S2 normal.  Pulmonary:     Effort: Pulmonary effort is normal.     Breath sounds: Normal breath sounds and air entry.  Abdominal:     General: Bowel sounds are normal.  Musculoskeletal:     Right lower leg: No edema.     Left lower leg: No edema.  Neurological:     Mental Status: She is alert and oriented to person, place, and time. Mental status is at baseline.     Gait: Gait is intact.  Psychiatric:         Mood and Affect: Mood and affect normal.        Speech: Speech normal.        Behavior: Behavior normal.        Judgment: Judgment normal.    {Labs (Optional):23779}    Assessment & Plan:  Encounter for screening for HIV -     HIV Antibody (routine testing w rflx)  Need for hepatitis C screening test -     Hepatitis C antibody  Irregular menstrual bleeding -     Comprehensive metabolic panel -     CBC -     TSH  Abnormal facial hair -     Testosterone  Morbid obesity (HCC) -     Hemoglobin A1c  Daytime somnolence -     Ambulatory referral to Pulmonology  Depression, major, single episode, moderate (HCC) -     Venlafaxine HCl ER; Take 1 capsule (75 mg total) by mouth daily with breakfast.  Dispense: 30 capsule; Refill: 2    Return in about 2 months (around 11/04/2022) for annual physical with pap smear.   Karie Georges, MD

## 2022-09-04 NOTE — Patient Instructions (Addendum)
Melatonin -- 5 mg  starting dose, may increase up to 20 mg at night to help with sleep.   Or try benadryl -- 1-2 tablets at bedtime.   Total number of calories per day: 2000 calories per day  Total amount of protein per day: 130 grams per day  Total amount of carbs per day: less than 90 grams per day  Multivitamin once daily  Stay as hydrated as you can. At least 64 ounces (1/2 gallon) per day

## 2022-09-05 DIAGNOSIS — L678 Other hair color and hair shaft abnormalities: Secondary | ICD-10-CM | POA: Insufficient documentation

## 2022-09-05 DIAGNOSIS — E559 Vitamin D deficiency, unspecified: Secondary | ICD-10-CM | POA: Insufficient documentation

## 2022-09-05 MED ORDER — VITAMIN D (ERGOCALCIFEROL) 1.25 MG (50000 UNIT) PO CAPS: 50000.0000 [IU] | ORAL_CAPSULE | ORAL | 1 refills | Status: DC

## 2022-09-05 NOTE — Assessment & Plan Note (Addendum)
I have had an extensive 30 minute conversation today with the patient about healthy eating habits, exercise, calorie and carb goals for sustainable and successful weight loss. I gave the patient caloric and protein daily intake values as well as described the importance of increasing fiber and water intake. I discussed weight loss medications that could be used in the treatment of this patient. Handouts on low carb eating were given to the patient.    I gave her a set of calorie and carb goals and advised 30 minutes of exercise 5 days a week. She is now enrolled in a physician supervised weight loss program and I will continue to monitor her progress every 3 months for follow up.

## 2022-09-05 NOTE — Assessment & Plan Note (Signed)
 History of, needs new level checked.

## 2022-09-05 NOTE — Assessment & Plan Note (Signed)
Most likely OSA, will send her to pulmonary for an evaluation

## 2022-09-05 NOTE — Assessment & Plan Note (Signed)
Most likely due to PCOS, checking testosterone level and other labs today. She has MO, likely OSA and now PCOS as co-morbid conditions.

## 2022-09-05 NOTE — Assessment & Plan Note (Signed)
We had a long discussion about medication options. I advised that we start effexor 75 mg daily for her symptoms, side effects were reviewed with patient. I will see her back short term for her annual physical with pap.

## 2022-09-07 ENCOUNTER — Ambulatory Visit: Payer: Medicaid Other | Admitting: Family Medicine

## 2022-09-07 DIAGNOSIS — Z1231 Encounter for screening mammogram for malignant neoplasm of breast: Secondary | ICD-10-CM

## 2022-09-10 ENCOUNTER — Encounter: Payer: Self-pay | Admitting: Family Medicine

## 2022-09-11 DIAGNOSIS — Z7251 High risk heterosexual behavior: Secondary | ICD-10-CM | POA: Diagnosis not present

## 2022-09-11 DIAGNOSIS — Z114 Encounter for screening for human immunodeficiency virus [HIV]: Secondary | ICD-10-CM | POA: Diagnosis not present

## 2022-09-11 DIAGNOSIS — Z6841 Body Mass Index (BMI) 40.0 and over, adult: Secondary | ICD-10-CM | POA: Diagnosis not present

## 2022-09-11 DIAGNOSIS — Z206 Contact with and (suspected) exposure to human immunodeficiency virus [HIV]: Secondary | ICD-10-CM | POA: Diagnosis not present

## 2022-09-20 ENCOUNTER — Ambulatory Visit (HOSPITAL_COMMUNITY)
Admission: EM | Admit: 2022-09-20 | Discharge: 2022-09-20 | Disposition: A | Discharge status: Home / Self Care | Payer: Medicaid Other | Attending: Family Medicine | Admitting: Family Medicine

## 2022-09-20 ENCOUNTER — Encounter (HOSPITAL_COMMUNITY): Payer: Self-pay

## 2022-09-20 DIAGNOSIS — R6883 Chills (without fever): Secondary | ICD-10-CM | POA: Insufficient documentation

## 2022-09-20 DIAGNOSIS — F17290 Nicotine dependence, other tobacco product, uncomplicated: Secondary | ICD-10-CM | POA: Insufficient documentation

## 2022-09-20 DIAGNOSIS — Z113 Encounter for screening for infections with a predominantly sexual mode of transmission: Secondary | ICD-10-CM | POA: Diagnosis not present

## 2022-09-20 DIAGNOSIS — J029 Acute pharyngitis, unspecified: Secondary | ICD-10-CM | POA: Diagnosis not present

## 2022-09-20 DIAGNOSIS — R5383 Other fatigue: Secondary | ICD-10-CM | POA: Diagnosis not present

## 2022-09-20 LAB — POCT RAPID STREP A (OFFICE): Rapid Strep A Screen: NEGATIVE

## 2022-09-20 NOTE — ED Triage Notes (Signed)
Patient here today with c/o ST, fatigue, chills, and has coughed up some thick mucous at times since Sunday. Pain is worse at night. Has has strep twice before. She also states that she preformed oral sex on Sunday and concerned about a STD.

## 2022-09-20 NOTE — ED Provider Notes (Signed)
  [facility]   [id number] 09/20/22 Arrival Time: 1225  ASSESSMENT & PLAN:  1. Sore throat   2. Screening for STDs (sexually transmitted diseases)    No signs of peritonsillar abscess.     Discharge Instructions      You may use over the counter ibuprofen or acetaminophen as needed.  For a sore throat, over the counter products such as [employer] Peroxyl Mouth Sore Rinse or Chloraseptic Sore Throat Spray may provide some temporary relief. Your rapid strep test was negative today. We have sent your throat swab for culture and will let you know of any positive results. We have also sent testing for sexually transmitted infections. We will notify you of any positive results once they are received. If required, we will prescribe any medications you might need.  Please refrain from all sexual activity for at least the next seven days.     Rapid strep pending. Oral and vaginal cytology pending.  Reviewed expectations re: course of current medical issues. Questions answered. Outlined signs and symptoms indicating need for more acute intervention. Patient verbalized understanding. After Visit Summary given.   SUBJECTIVE:  Brooke Wall is a 22 y.o. female who reports a sore throat. Also with fatigue, chills. Very mild cough today; yellow sputum. Throat pain is worse at night. States that she preformed oral sex 3 d ago and concerned about a STD.  Denies vaginal/urinary symptoms. No tx PTA.  OBJECTIVE:  Vitals:   09/20/22 1319 09/20/22 1320  BP:  110/76  Pulse:  88  Resp:  16  Temp:  99.3 F (37.4 C)  TempSrc:  Oral  SpO2:  97%  Weight: 132.5 kg   Height: 5\' 7"  (1.702 m)     General appearance: alert; no distress HEENT: throat with moderate erythema and cobblestoning; uvula is midline Neck: supple with FROM; no lymphadenopathy Lungs: speaks full sentences without difficulty; unlabored Abd: soft; non-tender Skin: reveals no rash; warm and dry Psychological:  alert and cooperative; normal mood and affect  No Known Allergies  Past Medical History:  Diagnosis Date   Asthma    Social History   Socioeconomic History   Marital status: Single    Spouse name: Not on file   Number of children: Not on file   Years of education: Not on file   Highest education level: Not on file  Occupational History   Not on file  Tobacco Use   Smoking status: Never   Smokeless tobacco: Never  Vaping Use   Vaping status: Every Day  Substance and Sexual Activity   Alcohol use: Yes    Comment: socially   Drug use: Never   Sexual activity: Yes  Other Topics Concern   Not on file  Social History Narrative   Not on file   Social Determinants of Health   Financial Resource Strain: Not on file  Food Insecurity: Not on file  Transportation Needs: Not on file  Physical Activity: Not on file  Stress: Not on file  Social Connections: Not on file  Intimate Partner Violence: Not on file   Family History  Problem Relation Age of Onset   Stroke Father    Depression Father    Alcohol abuse Father    Heart attack Father    Cancer Paternal Dulce Sellar, MD 09/20/22 [phone]

## 2022-09-20 NOTE — Discharge Instructions (Signed)
You may use over the counter ibuprofen or acetaminophen as needed.  For a sore throat, over the counter products such as [employer] Peroxyl Mouth Sore Rinse or Chloraseptic Sore Throat Spray may provide some temporary relief. Your rapid strep test was negative today. We have sent your throat swab for culture and will let you know of any positive results. We have also sent testing for sexually transmitted infections. We will notify you of any positive results once they are received. If required, we will prescribe any medications you might need.  Please refrain from all sexual activity for at least the next seven days.

## 2022-09-21 ENCOUNTER — Telehealth: Payer: Self-pay

## 2022-09-21 MED ORDER — FLUCONAZOLE 150 MG PO TABS: 150.0000 mg | ORAL_TABLET | Freq: Once | ORAL | 0 refills | Status: AC

## 2022-09-21 NOTE — Telephone Encounter (Signed)
 Per protocol, pt requires tx with Diflucan.  Attempted to reach patient x1. Unable to LVM. Rx sent to pharmacy on file.

## 2022-09-23 LAB — CULTURE, GROUP A STREP (THRC)

## 2022-09-27 LAB — CYTOLOGY, (ORAL, ANAL, URETHRAL) ANCILLARY ONLY
Chlamydia: NEGATIVE
Comment: NEGATIVE
Comment: NORMAL
Neisseria Gonorrhea: NEGATIVE

## 2022-09-27 LAB — CERVICOVAGINAL ANCILLARY ONLY
Bacterial Vaginitis (gardnerella): NEGATIVE
Candida Glabrata: NEGATIVE
Candida Vaginitis: POSITIVE — AB
Chlamydia: NEGATIVE
Comment: NEGATIVE
Comment: NEGATIVE
Comment: NEGATIVE
Comment: NEGATIVE
Comment: NEGATIVE
Comment: NORMAL
Neisseria Gonorrhea: NEGATIVE
Trichomonas: NEGATIVE

## 2022-09-28 ENCOUNTER — Other Ambulatory Visit: Payer: Self-pay | Admitting: Family Medicine

## 2022-09-28 DIAGNOSIS — F321 Major depressive disorder, single episode, moderate: Secondary | ICD-10-CM

## 2022-10-01 ENCOUNTER — Ambulatory Visit: Payer: Medicaid Other | Admitting: Family Medicine

## 2022-10-08 ENCOUNTER — Encounter (HOSPITAL_BASED_OUTPATIENT_CLINIC_OR_DEPARTMENT_OTHER): Payer: Self-pay | Admitting: Nurse Practitioner

## 2022-10-08 ENCOUNTER — Institutional Professional Consult (permissible substitution): Payer: Medicaid Other | Admitting: Nurse Practitioner

## 2022-10-11 ENCOUNTER — Emergency Department (HOSPITAL_BASED_OUTPATIENT_CLINIC_OR_DEPARTMENT_OTHER)
Admission: EM | Admit: 2022-10-11 | Discharge: 2022-10-11 | Disposition: A | Discharge status: Home / Self Care | Payer: Medicaid Other | Attending: Emergency Medicine | Admitting: Emergency Medicine

## 2022-10-11 ENCOUNTER — Other Ambulatory Visit: Payer: Self-pay

## 2022-10-11 ENCOUNTER — Emergency Department (HOSPITAL_BASED_OUTPATIENT_CLINIC_OR_DEPARTMENT_OTHER): Payer: Medicaid Other | Admitting: Radiology

## 2022-10-11 ENCOUNTER — Encounter (HOSPITAL_BASED_OUTPATIENT_CLINIC_OR_DEPARTMENT_OTHER): Payer: Self-pay | Admitting: Emergency Medicine

## 2022-10-11 DIAGNOSIS — R0602 Shortness of breath: Secondary | ICD-10-CM | POA: Insufficient documentation

## 2022-10-11 DIAGNOSIS — F419 Anxiety disorder, unspecified: Secondary | ICD-10-CM | POA: Insufficient documentation

## 2022-10-11 DIAGNOSIS — R0789 Other chest pain: Secondary | ICD-10-CM | POA: Insufficient documentation

## 2022-10-11 DIAGNOSIS — D649 Anemia, unspecified: Secondary | ICD-10-CM | POA: Diagnosis not present

## 2022-10-11 LAB — BASIC METABOLIC PANEL
Anion gap: 9 (ref 5–15)
BUN: 13 mg/dL (ref 6–20)
CO2: 26 mmol/L (ref 22–32)
Calcium: 9.1 mg/dL (ref 8.9–10.3)
Chloride: 104 mmol/L (ref 98–111)
Creatinine, Ser: 0.89 mg/dL (ref 0.44–1.00)
GFR, Estimated: >60 mL/min (ref >60)
Glucose, Bld: 93 mg/dL (ref 70–99)
Potassium: 3.5 mmol/L (ref 3.5–5.1)
Sodium: 139 mmol/L (ref 135–145)

## 2022-10-11 LAB — CBC
HCT: 35.5 % — ABNORMAL LOW (ref 36.0–46.0)
Hemoglobin: 10.5 g/dL — ABNORMAL LOW (ref 12.0–15.0)
MCH: 18.6 pg — ABNORMAL LOW (ref 26.0–34.0)
MCHC: 29.6 g/dL — ABNORMAL LOW (ref 30.0–36.0)
MCV: 62.9 fL — ABNORMAL LOW (ref 80.0–100.0)
Platelets: 537 10*3/uL — ABNORMAL HIGH (ref 150–400)
RBC: 5.64 MIL/uL — ABNORMAL HIGH (ref 3.87–5.11)
RDW: 19.6 % — ABNORMAL HIGH (ref 11.5–15.5)
WBC: 9.5 10*3/uL (ref 4.0–10.5)
nRBC: 0 % (ref 0.0–0.2)

## 2022-10-11 LAB — TROPONIN I (HIGH SENSITIVITY): Troponin I (High Sensitivity): <2 ng/L (ref <18)

## 2022-10-11 LAB — PREGNANCY, URINE: Preg Test, Ur: NEGATIVE

## 2022-10-11 NOTE — ED Triage Notes (Signed)
Pt with shob, chest tightness, and tingling in her hands and feet today.  Tachypneic during triage.  Pt has hx of panic attacks and has not been taking her anti depressant as it caused nausea.

## 2022-10-11 NOTE — ED Provider Notes (Signed)
[place] EMERGENCY DEPARTMENT AT [facility] [address] Provider Note   CSN: [id number] Arrival date & time: 10/11/22  1913     History  Chief Complaint  Patient presents with   Shortness of Breath    Brooke Wall is a 22 y.o. female with PMHx anxiety, depression, panic attacks who presents to ED concerned with SOB, chest tightness, and intermittent tingling in hands. Symtpoms started yesterday but resolved with melatonin. Symptoms occurred again today and patient was concerned so she came to ED. Patient endorsing hx of panic attacks since she was 22yo. Patient stating that she has had a lot of recent family deaths. States that she has not been managing her anxiety well and has not been taking anxiety medication due to side effects.  Patient stating that she was able to walk to the bathroom in the emergency room without shortness of breath.  Upon my initial interview, patient stating that her symptoms have resolved without medical intervention and she currently feels fine.  Denies fever, cough, nausea, vomiting, diarrhea, dysuria, hematuria, hematochezia.    Shortness of Breath      Home Medications Prior to Admission medications   Medication Sig Start Date End Date Taking? Authorizing Provider  albuterol (VENTOLIN HFA) 108 (90 Base) MCG/ACT inhaler Inhale 2 puffs into the lungs every 6 (six) hours as needed for wheezing or shortness of breath. 11/12/21   Leath-Warren, Sadie Haber, NP  venlafaxine XR (EFFEXOR-XR) 75 MG 24 hr capsule TAKE 1 CAPSULE BY MOUTH DAILY WITH BREAKFAST. 09/28/22   Karie Georges, MD  Vitamin D, Ergocalciferol, (DRISDOL) 1.25 MG (50000 UNIT) CAPS capsule Take 1 capsule (50,000 Units total) by mouth every 7 (seven) days. 09/05/22   Karie Georges, MD      Allergies    Patient has no known allergies.    Review of Systems   Review of Systems  Respiratory:  Positive for shortness of breath.     Physical Exam Updated Vital Signs BP 121/69   Pulse  79   Temp 98.1 F (36.7 C)   Resp 20   LMP 08/16/2022 (Approximate)   SpO2 100%  Physical Exam Vitals and nursing note reviewed.  Constitutional:      General: She is not in acute distress.    Appearance: She is not ill-appearing or toxic-appearing.  HENT:     Head: Normocephalic and atraumatic.     Mouth/Throat:     Mouth: Mucous membranes are moist.     Pharynx: No posterior oropharyngeal erythema.  Eyes:     General: No scleral icterus.       Right eye: No discharge.        Left eye: No discharge.     Conjunctiva/sclera: Conjunctivae normal.  Cardiovascular:     Rate and Rhythm: Normal rate and regular rhythm.     Pulses: Normal pulses.     Heart sounds: Normal heart sounds. No murmur heard. Pulmonary:     Effort: Pulmonary effort is normal. No respiratory distress.     Breath sounds: Normal breath sounds. No wheezing, rhonchi or rales.  Abdominal:     Tenderness: There is no abdominal tenderness.  Musculoskeletal:     Right lower leg: No edema.     Left lower leg: No edema.     Comments: No calf tenderness to palpation. No pitting edema.  Skin:    General: Skin is warm and dry.     Findings: No rash.  Neurological:     General: No focal  deficit present.     Mental Status: She is alert. Mental status is at baseline.     Comments: Sensation to light touch intact of BL UE and LE  Psychiatric:        Mood and Affect: Mood normal.        Behavior: Behavior normal.     ED Results / Procedures / Treatments   Labs (all labs ordered are listed, but only abnormal results are displayed) Labs Reviewed  CBC - Abnormal; Notable for the following components:      Result Value   RBC 5.64 (*)    Hemoglobin 10.5 (*)    HCT 35.5 (*)    MCV 62.9 (*)    MCH 18.6 (*)    MCHC 29.6 (*)    RDW 19.6 (*)    Platelets 537 (*)    All other components within normal limits  BASIC METABOLIC PANEL  PREGNANCY, URINE  TROPONIN I (HIGH SENSITIVITY)  TROPONIN I (HIGH SENSITIVITY)     EKG None  Radiology DG Chest 2 View  Result Date: 10/11/2022 CLINICAL DATA:  CP EXAM: CHEST - 2 VIEW COMPARISON:  Chest x-ray 10/12/2020 FINDINGS: The heart and mediastinal contours are within normal limits. No focal consolidation. No pulmonary edema. No pleural effusion. No pneumothorax. No acute osseous abnormality. IMPRESSION: No active cardiopulmonary disease. Electronically Signed   By: Tish Frederickson M.D.   On: 10/11/2022 20:49    Procedures Procedures    Medications Ordered in ED Medications - No data to display  ED Course/ Medical Decision Making/ A&P                                 Medical Decision Making Amount and/or Complexity of Data Reviewed Labs: ordered. Radiology: ordered.   This patient presents to the ED for concern of shortness of breath, this involves an extensive number of treatment options, and is a complaint that carries with it a high risk of complications and morbidity.  The differential diagnosis includes Anxiety, Anaphylaxis/Angioedema, Aspirated FB, Arrhythmia, CHF, Asthma, COPD, PNA, COVID/Flu/RSV, STEMI, Tamponade, TPNX, DKA, Sepsis, Toxin   Co morbidities that complicate the patient evaluation  anxiety, depression, panic attacks   Additional history obtained:  Dr Britta Mccreedy PCP   Lab Tests:  I Ordered, and personally interpreted labs.  The pertinent results include:   - BMP: no concern for electrolyte abnormality; no concern for kidney damage - Trop: within normal limits - CBC: Mild anemia: No leukocytosis - UPT: negative   Imaging Studies ordered:  I ordered imaging studies including  - chest xray: To assess for cardiopulmonary disease. I independently visualized and interpreted imaging  I agree with the radiologist interpretation    Problem List / ED Course / Critical interventions / Medication management  Patient presents to ED concern for SOB and chest tightness.  Symptoms happened yesterday and resolved with melatonin  and patient was able to go to sleep.  Patient stating that the symptoms recurred again, she became concerned, so she came to emergency room.  Patient stating that she has been having panic attacks since she was 22 years old.  Patient doing that she is not taking her anxiety medication due to side effects.  Physical exam unremarkable.  Patient is afebrile with stable vitals. Initial troponin within normal limits.  BMP reassuring.  CBC with without leukocytosis.  Patient does have anemia with hemoglobin at 10.5.  UPT negative.  Chest  x-ray without acute cardiopulmonary concern. Patient on cardiac monitor while I was in the room which was not showing any ST changes or arrhythmias.  I did order an EKG for the patient, but patient requesting to leave before I was able to obtain that EKG from nursing staff.  Also attempted to ambulate patient on pulse ox, patient also refusing this stating that her ride is here and she needs to leave.  Patient was able to ambulate to the bathroom without SOB during ED visit.  Patient also without tachycardia or hypoxia during my initial interview with her - less concerning for PE. I offered to obtain patient's d-dimer, but again, she stated that she had to leave.  I had extensive conversation with patient during my initial interview about following up with her primary care provider and trying out different antidepressant medications to see which one works best for her without giving her severe symptoms.  Patient stating that she has an appoint with her primary care provider in October.  I recommended patient follow-up with provider sooner.  Patient verbalized understanding of plan. I have reviewed the patients home medicines and have made adjustments as needed Patient was given return precautions. Patient stable for discharge at this time. Patient verbalized understanding of plan.  DDx: These are considered less likely due to history of present illness and physical exam  findings Anaphylaxis/Angioedema: physical exam reassuring Aspirated FB: no history of choking Arrhythmia/STEMI: EKG without concern Asthma/COPD/PNA/COVID/Flu/RSV: Lungs clear to auscultation bilaterally and O2 sat 100% Tamponade:chest xray without concern CHF: no physical exam findings TPNX: Lungs clear to auscultation bilaterally Sepsis: afebrile and other vital signs stable   Social Determinants of Health:  none           Final Clinical Impression(s) / ED Diagnoses Final diagnoses:  Anxiety  SOB (shortness of breath)  Chest tightness    Rx / DC Orders ED Discharge Orders     None         Margarita Rana 10/12/22 0002    Rondel Baton, MD 10/12/22 1215

## 2022-10-11 NOTE — ED Notes (Signed)
Pt denies chest pain, SOB or palpitations at this time, sts only twitching feeling in face and arms, sweating of palms and feet.

## 2022-10-11 NOTE — Discharge Instructions (Signed)
It was a pleasure caring for you today.  Lab workup was reassuring.  As discussed, I recommend following up with your primary care provider.  Please message them in the morning.  Seek emergency care if experiencing any new or worsening symptoms.

## 2022-10-12 ENCOUNTER — Encounter: Payer: Self-pay | Admitting: Nurse Practitioner

## 2022-11-05 ENCOUNTER — Ambulatory Visit: Payer: Medicaid Other | Admitting: Family Medicine

## 2022-12-07 ENCOUNTER — Ambulatory Visit: Payer: Medicaid Other | Admitting: Family Medicine

## 2022-12-14 ENCOUNTER — Ambulatory Visit (HOSPITAL_COMMUNITY)
Admission: EM | Admit: 2022-12-14 | Discharge: 2022-12-14 | Disposition: A | Discharge status: Home / Self Care | Payer: Medicaid Other | Attending: Family Medicine | Admitting: Family Medicine

## 2022-12-14 ENCOUNTER — Other Ambulatory Visit: Payer: Self-pay

## 2022-12-14 ENCOUNTER — Encounter (HOSPITAL_COMMUNITY): Payer: Self-pay | Admitting: *Deleted

## 2022-12-14 DIAGNOSIS — N760 Acute vaginitis: Secondary | ICD-10-CM | POA: Insufficient documentation

## 2022-12-14 MED ORDER — FLUCONAZOLE 150 MG PO TABS: 150.0000 mg | ORAL_TABLET | ORAL | 0 refills | Status: AC

## 2022-12-14 NOTE — ED Triage Notes (Signed)
Pt reports she has a vag discharge with itching. Pt also reports a cluster of red bite marks on RT hip.

## 2022-12-14 NOTE — ED Provider Notes (Signed)
[facility]    CSN: [id number] Arrival date & time: 12/14/22  1238      History   Chief Complaint Chief Complaint  Patient presents with   Vaginal Itching   Vaginal Discharge   Abdominal Pain   red skin irritation on Rt hip    HPI Brooke Wall is a 22 y.o. female.    Vaginal Itching Associated symptoms include abdominal pain.  Vaginal Discharge Associated symptoms: abdominal pain and vaginal itching   Abdominal Pain Associated symptoms: vaginal discharge   Here for vaginal itching and some thin white discharge.  Symptoms began yesterday.  She also has felt raw and some pain around the vaginal introitus.  No fever and no pelvic pain.  No fever and no vomiting  Last menstrual cycle was November 13.    Past Medical History:  Diagnosis Date   Asthma     Patient Active Problem List   Diagnosis Date Noted   Vitamin D deficiency 09/05/2022   Abnormal facial hair 09/05/2022   Morbid obesity (HCC) 09/04/2022   Daytime somnolence 09/04/2022   Depression, major, single episode, moderate (HCC) 09/04/2022   Keloid of skin 04/05/2022    Past Surgical History:  Procedure Laterality Date   KELOID EXCISION     left ear-2024   WISDOM TOOTH EXTRACTION      OB History   No obstetric history on file.      Home Medications    Prior to Admission medications   Medication Sig Start Date End Date Taking? Authorizing Provider  albuterol (VENTOLIN HFA) 108 (90 Base) MCG/ACT inhaler Inhale 2 puffs into the lungs every 6 (six) hours as needed for wheezing or shortness of breath. 11/12/21  Yes Leath-Warren, Sadie Haber, NP  fluconazole (DIFLUCAN) 150 MG tablet Take 1 tablet (150 mg total) by mouth every 3 (three) days for 2 doses. 12/14/22 12/18/22 Yes Lewanna Petrak, Janace Aris, MD  Vitamin D, Ergocalciferol, (DRISDOL) 1.25 MG (50000 UNIT) CAPS capsule Take 1 capsule (50,000 Units total) by mouth every 7 (seven) days. 09/05/22  Yes Karie Georges, MD  venlafaxine XR  (EFFEXOR-XR) 75 MG 24 hr capsule TAKE 1 CAPSULE BY MOUTH DAILY WITH BREAKFAST. 09/28/22   Karie Georges, MD    Family History Family History  Problem Relation Age of Onset   Stroke Father    Depression Father    Alcohol abuse Father    Heart attack Father    Cancer Paternal Grandmother     Social History Social History   Tobacco Use   Smoking status: Never   Smokeless tobacco: Never  Vaping Use   Vaping status: Every Day  Substance Use Topics   Alcohol use: Yes    Comment: socially   Drug use: Never     Allergies   Patient has no known allergies.   Review of Systems Review of Systems  Gastrointestinal:  Positive for abdominal pain.  Genitourinary:  Positive for vaginal discharge.     Physical Exam Triage Vital Signs ED Triage Vitals  Encounter Vitals Group     BP 12/14/22 1334 122/80     Systolic BP Percentile --      Diastolic BP Percentile --      Pulse Rate 12/14/22 1334 79     Resp 12/14/22 1334 18     Temp 12/14/22 1334 98.5 F (36.9 C)     Temp src --      SpO2 12/14/22 1334 96 %     Weight --  Height --      Head Circumference --      Peak Flow --      Pain Score 12/14/22 1332 0     Pain Loc --      Pain Education --      Exclude from Growth Chart --    No data found.  Updated Vital Signs BP 122/80   Pulse 79   Temp 98.5 F (36.9 C)   Resp 18   LMP 12/05/2022   SpO2 96%   Visual Acuity Right Eye Distance:   Left Eye Distance:   Bilateral Distance:    Right Eye Near:   Left Eye Near:    Bilateral Near:     Physical Exam Vitals reviewed.  Constitutional:      General: She is not in acute distress.    Appearance: She is not ill-appearing, toxic-appearing or diaphoretic.  HENT:     Mouth/Throat:     Mouth: Mucous membranes are moist.  Eyes:     Extraocular Movements: Extraocular movements intact.     Pupils: Pupils are equal, round, and reactive to light.  Genitourinary:    Comments: On visual inspection of the  external genitalia, there are no vesicular lesions or ulcerative lesions.  The vaginal introitus is quite erythematous.  There is no swelling or induration of the soft tissues.  There is some white discharge in the vaginal area.  Cervical vaginal swab is obtained at the time of exam.  Chaperone is present during the entire exam Skin:    Coloration: Skin is not jaundiced or pale.  Neurological:     Mental Status: She is alert and oriented to person, place, and time.  Psychiatric:        Behavior: Behavior normal.      UC Treatments / Results  Labs (all labs ordered are listed, but only abnormal results are displayed) Labs Reviewed  CERVICOVAGINAL ANCILLARY ONLY    EKG   Radiology No results found.  Procedures Procedures (including critical care time)  Medications Ordered in UC Medications - No data to display  Initial Impression / Assessment and Plan / UC Course  I have reviewed the triage vital signs and the nursing notes.  Pertinent labs & imaging results that were available during my care of the patient were reviewed by me and considered in my medical decision making (see chart for details).     Fluconazole is sent in for possible yeast vaginitis. Vaginal self swab is done, and we will notify of any positives on that and treat per protocol.  She voiced concerned about herpes.  We discussed incubation period and potential symptoms of that type of infection.  I did not see any lesions indicative of a genital herpes exam today. Final Clinical Impressions(s) / UC Diagnoses   Final diagnoses:  Acute vaginitis     Discharge Instructions      Take fluconazole 150 mg--1 tablet every 3 days for 2 doses  Staff will notify you if there is anything positive on the swab.  I printed you some information about genital herpes in case it helps (though nothing like herpes was seen on your exam today)       ED Prescriptions     Medication Sig Dispense Auth. Provider    fluconazole (DIFLUCAN) 150 MG tablet Take 1 tablet (150 mg total) by mouth every 3 (three) days for 2 doses. 2 tablet Marlinda Mike Janace Aris, MD      PDMP not reviewed this  encounter.   Zenia Resides, MD 12/14/22 [phone]

## 2022-12-14 NOTE — Discharge Instructions (Addendum)
Take fluconazole 150 mg--1 tablet every 3 days for 2 doses  Staff will notify you if there is anything positive on the swab.  I printed you some information about genital herpes in case it helps (though nothing like herpes was seen on your exam today)

## 2022-12-17 ENCOUNTER — Ambulatory Visit: Payer: Medicaid Other | Admitting: Family Medicine

## 2022-12-17 ENCOUNTER — Telehealth (HOSPITAL_COMMUNITY): Payer: Self-pay | Admitting: Emergency Medicine

## 2022-12-17 LAB — CERVICOVAGINAL ANCILLARY ONLY
Bacterial Vaginitis (gardnerella): POSITIVE — AB
Candida Glabrata: NEGATIVE
Candida Vaginitis: POSITIVE — AB
Chlamydia: NEGATIVE
Comment: NEGATIVE
Comment: NEGATIVE
Comment: NEGATIVE
Comment: NEGATIVE
Comment: NEGATIVE
Comment: NORMAL
Neisseria Gonorrhea: NEGATIVE
Trichomonas: NEGATIVE

## 2022-12-17 MED ORDER — METRONIDAZOLE 500 MG PO TABS: 500.0000 mg | ORAL_TABLET | Freq: Two times a day (BID) | ORAL | 0 refills | Status: DC

## 2022-12-17 NOTE — Telephone Encounter (Signed)
Metronidazole for positive BV, per protocol

## 2023-01-27 ENCOUNTER — Other Ambulatory Visit: Payer: Self-pay

## 2023-01-27 DIAGNOSIS — R531 Weakness: Secondary | ICD-10-CM | POA: Diagnosis present

## 2023-01-27 DIAGNOSIS — D649 Anemia, unspecified: Secondary | ICD-10-CM | POA: Diagnosis not present

## 2023-01-27 DIAGNOSIS — J45909 Unspecified asthma, uncomplicated: Secondary | ICD-10-CM | POA: Diagnosis not present

## 2023-01-27 DIAGNOSIS — E876 Hypokalemia: Secondary | ICD-10-CM | POA: Diagnosis not present

## 2023-01-27 DIAGNOSIS — M545 Low back pain, unspecified: Secondary | ICD-10-CM | POA: Diagnosis not present

## 2023-01-27 DIAGNOSIS — R5383 Other fatigue: Secondary | ICD-10-CM | POA: Diagnosis not present

## 2023-01-27 DIAGNOSIS — Z20822 Contact with and (suspected) exposure to covid-19: Secondary | ICD-10-CM | POA: Diagnosis not present

## 2023-01-27 NOTE — ED Triage Notes (Signed)
 Pt with c/o lower back pain x1 weak. Pt also has been fatigued since today. Pt feeling weak all over. Denies cold symptoms. Denies fever. While in lobby pt left hand felt tingly. But has now resolved.

## 2023-01-28 ENCOUNTER — Other Ambulatory Visit: Payer: Self-pay

## 2023-01-28 ENCOUNTER — Emergency Department (HOSPITAL_BASED_OUTPATIENT_CLINIC_OR_DEPARTMENT_OTHER)
Admission: EM | Admit: 2023-01-28 | Discharge: 2023-01-28 | Disposition: A | Discharge status: Home / Self Care | Payer: Medicaid Other | Attending: Emergency Medicine | Admitting: Emergency Medicine

## 2023-01-28 ENCOUNTER — Encounter (HOSPITAL_BASED_OUTPATIENT_CLINIC_OR_DEPARTMENT_OTHER): Payer: Self-pay

## 2023-01-28 ENCOUNTER — Emergency Department (HOSPITAL_BASED_OUTPATIENT_CLINIC_OR_DEPARTMENT_OTHER): Payer: Medicaid Other

## 2023-01-28 DIAGNOSIS — M545 Low back pain, unspecified: Secondary | ICD-10-CM

## 2023-01-28 DIAGNOSIS — R29818 Other symptoms and signs involving the nervous system: Secondary | ICD-10-CM | POA: Diagnosis not present

## 2023-01-28 DIAGNOSIS — R5383 Other fatigue: Secondary | ICD-10-CM

## 2023-01-28 LAB — BASIC METABOLIC PANEL
Anion gap: 11 (ref 5–15)
BUN: 8 mg/dL (ref 6–20)
CO2: 25 mmol/L (ref 22–32)
Calcium: 9.3 mg/dL (ref 8.9–10.3)
Chloride: 105 mmol/L (ref 98–111)
Creatinine, Ser: 0.87 mg/dL (ref 0.44–1.00)
GFR, Estimated: >60 mL/min (ref >60)
Glucose, Bld: 130 mg/dL — ABNORMAL HIGH (ref 70–99)
Potassium: 3.1 mmol/L — ABNORMAL LOW (ref 3.5–5.1)
Sodium: 141 mmol/L (ref 135–145)

## 2023-01-28 LAB — TSH: TSH: 1.497 u[IU]/mL (ref 0.350–4.500)

## 2023-01-28 LAB — URINALYSIS, ROUTINE W REFLEX MICROSCOPIC
Bilirubin Urine: NEGATIVE
Glucose, UA: NEGATIVE mg/dL
Ketones, ur: NEGATIVE mg/dL
Leukocytes,Ua: NEGATIVE
Nitrite: NEGATIVE
Protein, ur: NEGATIVE mg/dL
Specific Gravity, Urine: 1.011 (ref 1.005–1.030)
pH: 6 (ref 5.0–8.0)

## 2023-01-28 LAB — HEPATIC FUNCTION PANEL
ALT: 10 U/L (ref 0–44)
AST: 12 U/L — ABNORMAL LOW (ref 15–41)
Albumin: 4.4 g/dL (ref 3.5–5.0)
Alkaline Phosphatase: 71 U/L (ref 38–126)
Bilirubin, Direct: 0.1 mg/dL (ref 0.0–0.2)
Indirect Bilirubin: 0.2 mg/dL — ABNORMAL LOW (ref 0.3–0.9)
Total Bilirubin: 0.3 mg/dL (ref 0.0–1.2)
Total Protein: 7.8 g/dL (ref 6.5–8.1)

## 2023-01-28 LAB — CBC
HCT: 36.2 % (ref 36.0–46.0)
Hemoglobin: 10.8 g/dL — ABNORMAL LOW (ref 12.0–15.0)
MCH: 18.6 pg — ABNORMAL LOW (ref 26.0–34.0)
MCHC: 29.8 g/dL — ABNORMAL LOW (ref 30.0–36.0)
MCV: 62.4 fL — ABNORMAL LOW (ref 80.0–100.0)
Platelets: 472 10*3/uL — ABNORMAL HIGH (ref 150–400)
RBC: 5.8 MIL/uL — ABNORMAL HIGH (ref 3.87–5.11)
RDW: 20.2 % — ABNORMAL HIGH (ref 11.5–15.5)
WBC: 9.4 10*3/uL (ref 4.0–10.5)
nRBC: 0 % (ref 0.0–0.2)

## 2023-01-28 LAB — MAGNESIUM: Magnesium: 1.9 mg/dL (ref 1.7–2.4)

## 2023-01-28 LAB — RESP PANEL BY RT-PCR (RSV, FLU A&B, COVID)  RVPGX2
Influenza A by PCR: NEGATIVE
Influenza B by PCR: NEGATIVE
Resp Syncytial Virus by PCR: NEGATIVE
SARS Coronavirus 2 by RT PCR: NEGATIVE

## 2023-01-28 LAB — LIPASE, BLOOD: Lipase: 23 U/L (ref 11–51)

## 2023-01-28 LAB — D-DIMER, QUANTITATIVE: D-Dimer, Quant: <0.27 ug{FEU}/mL (ref 0.00–0.50)

## 2023-01-28 LAB — PREGNANCY, URINE: Preg Test, Ur: NEGATIVE

## 2023-01-28 LAB — CBG MONITORING, ED: Glucose-Capillary: 121 mg/dL — ABNORMAL HIGH (ref 70–99)

## 2023-01-28 MED ORDER — LACTATED RINGERS IV BOLUS
1000.0000 mL | Freq: Once | INTRAVENOUS | Status: AC
  Administered 2023-01-28: 1000 mL via INTRAVENOUS

## 2023-01-28 MED ORDER — KETOROLAC TROMETHAMINE 30 MG/ML IJ SOLN
15.0000 mg | Freq: Once | INTRAMUSCULAR | Status: AC
  Administered 2023-01-28: 15 mg via INTRAVENOUS
  Filled 2023-01-28: qty 1

## 2023-01-28 MED ORDER — ONDANSETRON HCL 4 MG/2ML IJ SOLN
4.0000 mg | Freq: Once | INTRAMUSCULAR | Status: AC
  Administered 2023-01-28: 4 mg via INTRAVENOUS
  Filled 2023-01-28: qty 2

## 2023-01-28 MED ORDER — METHOCARBAMOL 500 MG PO TABS
500.0000 mg | ORAL_TABLET | Freq: Three times a day (TID) | ORAL | 0 refills | Status: DC | PRN
Start: 2023-01-28 — End: 2023-03-19

## 2023-01-28 MED ORDER — POTASSIUM CHLORIDE CRYS ER 20 MEQ PO TBCR
40.0000 meq | EXTENDED_RELEASE_TABLET | Freq: Once | ORAL | Status: AC
  Administered 2023-01-28: 40 meq via ORAL
  Filled 2023-01-28: qty 2

## 2023-01-28 MED ORDER — NAPROXEN 500 MG PO TABS: 500.0000 mg | ORAL_TABLET | Freq: Two times a day (BID) | ORAL | 0 refills | Status: DC | PRN

## 2023-01-28 MED ORDER — POTASSIUM CHLORIDE CRYS ER 20 MEQ PO TBCR: 20.0000 meq | EXTENDED_RELEASE_TABLET | Freq: Two times a day (BID) | ORAL | 0 refills | Status: DC

## 2023-01-28 NOTE — ED Notes (Signed)
 Pt requested ginger ale for PO challenge

## 2023-01-28 NOTE — ED Notes (Signed)
 DURING TRIAGE PT STATED THAT BOTH HANDS WENT NUMB WHILE IN THE LOBBY. BUT HAS RESOLVED

## 2023-01-28 NOTE — Discharge Instructions (Addendum)
 Your testing is reassuring.  As we discussed you may have passed a kidney stone.  No evidence of serious spinal cord problem.  Your potassium was slightly low and you should take the potassium supplementation as prescribed.  Follow-up with your doctor to recheck your potassium next week.  Keep yourself hydrated.  Return to the ED with new or worsening symptoms.

## 2023-01-28 NOTE — ED Triage Notes (Signed)
 Pt grips equal BL but weak. No neuro symptoms

## 2023-01-28 NOTE — ED Provider Notes (Signed)
 [place] EMERGENCY DEPARTMENT AT [facility] Provider Note   CSN: [id number] Arrival date & time: 01/27/23  2333     History  Chief Complaint  Patient presents with   Back Pain   Fatigue    Brooke Wall is a 23 y.o. female.  Patient with history of asthma here with left little low back pain and generalized weakness.  States she has had left-sided low back pain for the past approximately 1 week.  Denies any fall or injury.  Pain starts to her left back and radiates to the midline.  No radiation of the pain down her buttocks or down her leg.  No focal weakness, numbness or tingling.  No bowel or bladder incontinence.  No fever or vomiting.  No pain with urination or blood in the urine.  She is been taking ibuprofen for the pain with partial relief.  Pain comes and goes and waxes and wanes in severity.  She had acute onset of generalized weakness tonight about 9 PM while she was walking to her bedroom.  She felt suddenly weak all over, lightheaded, folic she is going to pass out.  Feels generally weak and fatigued and like she cannot support her body weight.  Denies any headache.  Denies any recent cough, URI symptoms.  Denies any fever.  Denies any abdominal pain, nausea, vomiting, chest pain or shortness of breath. Reports she has had a poor appetite and not been eating or drinking much recently.  No diarrhea or constipation. Did not pass out or lose consciousness.  No room spinning dizziness.  While she was waiting in the waiting room she had tingling to her bilateral hands that lasted about 20 minutes and is now resolved.  Still feels weak all over.  The history is provided by the patient.  Back Pain Associated symptoms: weakness   Associated symptoms: no abdominal pain, no chest pain, no dysuria, no fever and no headaches        Home Medications Prior to Admission medications   Medication Sig Start Date End Date Taking? Authorizing Provider  albuterol  (VENTOLIN  HFA)  108 (90 Base) MCG/ACT inhaler Inhale 2 puffs into the lungs every 6 (six) hours as needed for wheezing or shortness of breath. 11/12/21   Leath-Warren, Etta PARAS, NP  metroNIDAZOLE  (FLAGYL ) 500 MG tablet Take 1 tablet (500 mg total) by mouth 2 (two) times daily. 12/17/22   Blaise Aleene KIDD, MD  venlafaxine  XR (EFFEXOR -XR) 75 MG 24 hr capsule TAKE 1 CAPSULE BY MOUTH DAILY WITH BREAKFAST. 09/28/22   Ozell Heron HERO, MD  Vitamin D , Ergocalciferol , (DRISDOL ) 1.25 MG (50000 UNIT) CAPS capsule Take 1 capsule (50,000 Units total) by mouth every 7 (seven) days. 09/05/22   Ozell Heron HERO, MD      Allergies    Patient has no known allergies.    Review of Systems   Review of Systems  Constitutional:  Positive for activity change and fatigue. Negative for appetite change, chills and fever.  HENT:  Negative for congestion and rhinorrhea.   Respiratory:  Negative for cough, chest tightness and shortness of breath.   Cardiovascular:  Negative for chest pain.  Gastrointestinal:  Positive for nausea. Negative for abdominal pain and vomiting.  Genitourinary:  Negative for dysuria.  Musculoskeletal:  Positive for arthralgias, back pain and myalgias.  Skin:  Negative for rash.  Neurological:  Positive for weakness and light-headedness. Negative for dizziness and headaches.   all other systems are negative except as noted in the HPI  and PMH.    Physical Exam Updated Vital Signs BP (!) 124/47 (BP Location: Right Arm)   Pulse 80   Temp 98.4 F (36.9 C) (Oral)   Resp 16   Ht 5' 7 (1.702 m)   Wt 130.2 kg   LMP 01/17/2023   SpO2 100%   BMI 44.95 kg/m  Physical Exam Vitals and nursing note reviewed.  Constitutional:      General: She is not in acute distress.    Appearance: She is well-developed. She is obese.     Comments: Appears fatigued, slow movements  HENT:     Head: Normocephalic and atraumatic.     Mouth/Throat:     Pharynx: No oropharyngeal exudate.  Eyes:     Conjunctiva/sclera:  Conjunctivae normal.     Pupils: Pupils are equal, round, and reactive to light.  Neck:     Comments: No meningismus. Cardiovascular:     Rate and Rhythm: Normal rate and regular rhythm.     Heart sounds: Normal heart sounds. No murmur heard. Pulmonary:     Effort: Pulmonary effort is normal. No respiratory distress.     Breath sounds: Normal breath sounds.  Abdominal:     Palpations: Abdomen is soft.     Tenderness: There is no abdominal tenderness. There is no guarding or rebound.  Musculoskeletal:        General: Tenderness present. Normal range of motion.     Cervical back: Normal range of motion and neck supple.     Comments: Left paraspinal lumbar tenderness, no midline tenderness. No rash.  5/5 strength in bilateral lower extremities. Ankle plantar and dorsiflexion intact. Great toe extension intact bilaterally. +2 DP and PT pulses. +2 patellar reflexes bilaterally. Normal gait.    Skin:    General: Skin is warm.  Neurological:     Mental Status: She is alert and oriented to person, place, and time.     Cranial Nerves: No cranial nerve deficit.     Motor: No abnormal muscle tone.     Coordination: Coordination normal.     Comments: Overall poor effort on neurological exam but generally weak without focal deficits. No ataxia on finger-to-nose.  No nystagmus.  No facial droop.   5/5 strength throughout. CN 2-12 intact.Equal grip strength.   Psychiatric:        Behavior: Behavior normal.     ED Results / Procedures / Treatments   Labs (all labs ordered are listed, but only abnormal results are displayed) Labs Reviewed  BASIC METABOLIC PANEL - Abnormal; Notable for the following components:      Result Value   Potassium 3.1 (*)    Glucose, Bld 130 (*)    All other components within normal limits  CBC - Abnormal; Notable for the following components:   RBC 5.80 (*)    Hemoglobin 10.8 (*)    MCV 62.4 (*)    MCH 18.6 (*)    MCHC 29.8 (*)    RDW 20.2 (*)     Platelets 472 (*)    All other components within normal limits  URINALYSIS, ROUTINE W REFLEX MICROSCOPIC - Abnormal; Notable for the following components:   Hgb urine dipstick SMALL (*)    Bacteria, UA RARE (*)    All other components within normal limits  HEPATIC FUNCTION PANEL - Abnormal; Notable for the following components:   AST 12 (*)    Indirect Bilirubin 0.2 (*)    All other components within normal limits  CBG MONITORING,  ED - Abnormal; Notable for the following components:   Glucose-Capillary 121 (*)    All other components within normal limits  RESP PANEL BY RT-PCR (RSV, FLU A&B, COVID)  RVPGX2  PREGNANCY, URINE  LIPASE, BLOOD  TSH  MAGNESIUM  D-DIMER, QUANTITATIVE    EKG EKG Interpretation Date/Time:  Monday January 28 2023 00:21:13 EST Ventricular Rate:  81 PR Interval:  194 QRS Duration:  82 QT Interval:  346 QTC Calculation: 401 R Axis:   57  Text Interpretation: Normal sinus rhythm Nonspecific T wave abnormality Abnormal ECG When compared with ECG of 11-Oct-2022 19:25, No significant change was found No significant change was found Confirmed by Carita Senior [phone]) on 01/28/2023 12:31:10 AM  Radiology CT Head Wo Contrast Result Date: 01/28/2023 CLINICAL DATA:  Neuro deficit, acute, stroke suspected EXAM: CT HEAD WITHOUT CONTRAST TECHNIQUE: Contiguous axial images were obtained from the base of the skull through the vertex without intravenous contrast. RADIATION DOSE REDUCTION: This exam was performed according to the departmental dose-optimization program which includes automated exposure control, adjustment of the mA and/or kV according to patient size and/or use of iterative reconstruction technique. COMPARISON:  None Available. FINDINGS: Brain: No evidence of acute large vascular territory infarction, hemorrhage, hydrocephalus, extra-axial collection or mass lesion/mass effect. Vascular: No hyperdense vessel or unexpected calcification. Skull: No acute fracture.  Sinuses/Orbits: Clear sinuses.  No acute orbital findings. Other: No mastoid effusions. IMPRESSION: No evidence of acute intracranial abnormality. Electronically Signed   By: Gilmore GORMAN Molt M.D.   On: 01/28/2023 02:19   CT Renal Stone Study Result Date: 01/28/2023 CLINICAL DATA:  Abdominal/flank pain, stone suspected. Low back pain. EXAM: CT ABDOMEN AND PELVIS WITHOUT CONTRAST TECHNIQUE: Multidetector CT imaging of the abdomen and pelvis was performed following the standard protocol without IV contrast. RADIATION DOSE REDUCTION: This exam was performed according to the departmental dose-optimization program which includes automated exposure control, adjustment of the mA and/or kV according to patient size and/or use of iterative reconstruction technique. COMPARISON:  None Available. FINDINGS: Lower chest: No acute abnormality Hepatobiliary: No focal hepatic abnormality. Gallbladder unremarkable. Pancreas: No focal abnormality or ductal dilatation. Spleen: No focal abnormality.  Normal size. Adrenals/Urinary Tract: No adrenal abnormality. No focal renal abnormality. No stones or hydronephrosis. Urinary bladder is unremarkable. Stomach/Bowel: Normal appendix. Stomach, large and small bowel grossly unremarkable. Vascular/Lymphatic: No evidence of aneurysm or adenopathy. Reproductive: Uterus and adnexa unremarkable.  No mass. Other: No free fluid or free air. Musculoskeletal: No acute bony abnormality. IMPRESSION: No renal or ureteral stones.  No hydronephrosis. No acute findings. Electronically Signed   By: Franky Crease M.D.   On: 01/28/2023 02:18    Procedures Procedures    Medications Ordered in ED Medications  lactated ringers  bolus 1,000 mL (has no administration in time range)  ondansetron  (ZOFRAN ) injection 4 mg (has no administration in time range)  potassium chloride  SA (KLOR-CON  M) CR tablet 40 mEq (has no administration in time range)    ED Course/ Medical Decision Making/ A&P                                  Medical Decision Making Amount and/or Complexity of Data Reviewed Labs: ordered. Decision-making details documented in ED Course. Radiology: ordered and independent interpretation performed. Decision-making details documented in ED Course. ECG/medicine tests: ordered and independent interpretation performed. Decision-making details documented in ED Course.  Risk Prescription drug management.   1 week of left-sided  back pain without injury.  Now with acute onset of generalized weakness and fatigue.  Stable vitals.  No distress.  Neurological exam is nonfocal with low suspicion for CVA or TIA.  Low suspicion for cord compression or cauda equina.  Intact distal strength, sensation, pulses and reflexes.  Labs show stable anemia 10.8.  Hypokalemia 3.1 is replaced.  COVID and flu swabs are negative.  Magnesium is normal.  HCG negative. Small blood in UA. Consider kidney stone. Will check CT renal.  Orthostatics are negative.   Remainder of labs reassuring.  TSH and magnesium normal.  Consider possible passed kidney stone.  No obstruction seen on CT renal.  CT head is negative.  Neurologic exam is nonfocal.  Low suspicion for CVA, TIA, cord compression or cauda equina.  Patient feels Improved.  She is tolerating p.o. and able to ambulate.  Back pain has improved with Toradol . Concern for passed kidney stone versus musculoskeletal back pain.  Low suspicion for cord compression or cauda equina.  Will treat with potassium supplementation, anti-inflammatories and muscle relaxers.  PCP follow-up.  Keep self hydrated at home.  Return to the ED with new or worsening symptoms         Final Clinical Impression(s) / ED Diagnoses Final diagnoses:  Acute left-sided low back pain without sciatica  Other fatigue    Rx / DC Orders ED Discharge Orders     None         Nicoli Nardozzi, Garnette, MD 01/28/23 [phone]

## 2023-01-28 NOTE — ED Notes (Signed)
 Pt ambulatory to the bathroom

## 2023-02-04 ENCOUNTER — Ambulatory Visit (INDEPENDENT_AMBULATORY_CARE_PROVIDER_SITE_OTHER): Payer: Medicaid Other | Admitting: Family Medicine

## 2023-02-04 ENCOUNTER — Encounter: Payer: Self-pay | Admitting: Family Medicine

## 2023-02-04 ENCOUNTER — Other Ambulatory Visit (HOSPITAL_COMMUNITY)
Admission: RE | Admit: 2023-02-04 | Discharge: 2023-02-04 | Disposition: A | Discharge status: Home / Self Care | Payer: Medicaid Other | Source: Ambulatory Visit | Attending: Family Medicine | Admitting: Family Medicine

## 2023-02-04 VITALS — BP 122/68 | HR 75 | Temp 99.0°F | Ht 66.75 in | Wt 293.8 lb

## 2023-02-04 DIAGNOSIS — Z124 Encounter for screening for malignant neoplasm of cervix: Secondary | ICD-10-CM | POA: Insufficient documentation

## 2023-02-04 DIAGNOSIS — Z0000 Encounter for general adult medical examination without abnormal findings: Secondary | ICD-10-CM

## 2023-02-04 DIAGNOSIS — E559 Vitamin D deficiency, unspecified: Secondary | ICD-10-CM | POA: Diagnosis not present

## 2023-02-04 DIAGNOSIS — Z6841 Body Mass Index (BMI) 40.0 and over, adult: Secondary | ICD-10-CM | POA: Diagnosis not present

## 2023-02-04 DIAGNOSIS — L678 Other hair color and hair shaft abnormalities: Secondary | ICD-10-CM

## 2023-02-04 DIAGNOSIS — D500 Iron deficiency anemia secondary to blood loss (chronic): Secondary | ICD-10-CM

## 2023-02-04 MED ORDER — WEGOVY 0.5 MG/0.5ML ~~LOC~~ SOAJ: 0.5000 mg | SUBCUTANEOUS | 0 refills | Status: DC

## 2023-02-04 MED ORDER — SPIRONOLACTONE 25 MG PO TABS: 25.0000 mg | ORAL_TABLET | Freq: Every day | ORAL | 0 refills | Status: AC

## 2023-02-04 MED ORDER — FERROUS SULFATE 325 (65 FE) MG PO TBEC: 325.0000 mg | DELAYED_RELEASE_TABLET | Freq: Every day | ORAL | 1 refills | Status: AC

## 2023-02-04 MED ORDER — WEGOVY 1 MG/0.5ML ~~LOC~~ SOAJ: 1.0000 mg | SUBCUTANEOUS | 0 refills | Status: DC

## 2023-02-04 MED ORDER — WEGOVY 0.25 MG/0.5ML ~~LOC~~ SOAJ: 0.2500 mg | SUBCUTANEOUS | 0 refills | Status: DC

## 2023-02-04 NOTE — Patient Instructions (Addendum)
 [employer] life brand protein shakes are lower is sugar   High protein geek yogurts Health Maintenance, Female Adopting a healthy lifestyle and getting preventive care are important in promoting health and wellness. Ask your health care provider about: The right schedule for you to have regular tests and exams. Things you can do on your own to prevent diseases and keep yourself healthy. What should I know about diet, weight, and exercise? Eat a healthy diet  Eat a diet that includes plenty of vegetables, fruits, low-fat dairy products, and lean protein. Do not eat a lot of foods that are high in solid fats, added sugars, or sodium. Maintain a healthy weight Body mass index (BMI) is used to identify weight problems. It estimates body fat based on height and weight. Your health care provider can help determine your BMI and help you achieve or maintain a healthy weight. Get regular exercise Get regular exercise. This is one of the most important things you can do for your health. Most adults should: Exercise for at least 150 minutes each week. The exercise should increase your heart rate and make you sweat (moderate-intensity exercise). Do strengthening exercises at least twice a week. This is in addition to the moderate-intensity exercise. Spend less time sitting. Even light physical activity can be beneficial. Watch cholesterol and blood lipids Have your blood tested for lipids and cholesterol at 25 years of age, then have this test every 5 years. Have your cholesterol levels checked more often if: Your lipid or cholesterol levels are high. You are older than 25 years of age. You are at high risk for heart disease. What should I know about cancer screening? Depending on your health history and family history, you may need to have cancer screening at various ages. This may include screening for: Breast cancer. Cervical cancer. Colorectal cancer. Skin cancer. Lung cancer. What should I know  about heart disease, diabetes, and high blood pressure? Blood pressure and heart disease High blood pressure causes heart disease and increases the risk of stroke. This is more likely to develop in people who have high blood pressure readings or are overweight. Have your blood pressure checked: Every 3-5 years if you are 65-76 years of age. Every year if you are 77 years old or older. Diabetes Have regular diabetes screenings. This checks your fasting blood sugar level. Have the screening done: Once every three years after age 3 if you are at a normal weight and have a low risk for diabetes. More often and at a younger age if you are overweight or have a high risk for diabetes. What should I know about preventing infection? Hepatitis B If you have a higher risk for hepatitis B, you should be screened for this virus. Talk with your health care provider to find out if you are at risk for hepatitis B infection. Hepatitis C Testing is recommended for: Everyone born from [birth year] through 1967. Anyone with known risk factors for hepatitis C. Sexually transmitted infections (STIs) Get screened for STIs, including gonorrhea and chlamydia, if: You are sexually active and are younger than 25 years of age. You are older than 25 years of age and your health care provider tells you that you are at risk for this type of infection. Your sexual activity has changed since you were last screened, and you are at increased risk for chlamydia or gonorrhea. Ask your health care provider if you are at risk. Ask your health care provider about whether you are at high risk  for HIV. Your health care provider may recommend a prescription medicine to help prevent HIV infection. If you choose to take medicine to prevent HIV, you should first get tested for HIV. You should then be tested every 3 months for as long as you are taking the medicine. Pregnancy If you are about to stop having your period (premenopausal) and you  may become pregnant, seek counseling before you get pregnant. Take 400 to 800 micrograms (mcg) of folic acid every day if you become pregnant. Ask for birth control (contraception) if you want to prevent pregnancy. Osteoporosis and menopause Osteoporosis is a disease in which the bones lose minerals and strength with aging. This can result in bone fractures. If you are 69 years old or older, or if you are at risk for osteoporosis and fractures, ask your health care provider if you should: Be screened for bone loss. Take a calcium or vitamin D  supplement to lower your risk of fractures. Be given hormone replacement therapy (HRT) to treat symptoms of menopause. Follow these instructions at home: Alcohol use Do not drink alcohol if: Your health care provider tells you not to drink. You are pregnant, may be pregnant, or are planning to become pregnant. If you drink alcohol: Limit how much you have to: 0-1 drink a day. Know how much alcohol is in your drink. In the [place] one drink equals one 12 oz bottle of beer (355 mL), one 5 oz glass of wine (148 mL), or one 1 oz glass of hard liquor (44 mL). Lifestyle Do not use any products that contain nicotine or tobacco. These products include cigarettes, chewing tobacco, and vaping devices, such as e-cigarettes. If you need help quitting, ask your health care provider. Do not use street drugs. Do not share needles. Ask your health care provider for help if you need support or information about quitting drugs. General instructions Schedule regular health, dental, and eye exams. Stay current with your vaccines. Tell your health care provider if: You often feel depressed. You have ever been abused or do not feel safe at home. Summary Adopting a healthy lifestyle and getting preventive care are important in promoting health and wellness. Follow your health care provider's instructions about healthy diet, exercising, and getting tested or screened for  diseases. Follow your health care provider's instructions on monitoring your cholesterol and blood pressure. This information is not intended to replace advice given to you by your health care provider. Make sure you discuss any questions you have with your health care provider. Document Revised: 05/30/2020 Document Reviewed: 05/30/2020 Elsevier Patient Education  2024 [employer].

## 2023-02-04 NOTE — Progress Notes (Addendum)
 Complete physical exam  Patient: Brooke Wall   DOB: 2000-02-28   22 y.o. Female  MRN: [id number]  Subjective:    Chief Complaint  Patient presents with   Annual Exam    Brooke Wall is a 23 y.o. female who presents today for a complete physical exam. She reports consuming a general diet. The patient does not participate in regular exercise at present. She generally feels well. She reports sleeping well. She does have additional problems to discuss today.   I have had an extensive 30 minute conversation today with the patient about healthy eating habits, exercise, calorie and carb goals for sustainable and successful weight loss. I gave the patient caloric and protein daily intake values as well as described the importance of increasing fiber and water intake. I discussed weight loss medications that could be used in the treatment of this patient. Handouts on low carb eating were given to the patient.    We talked about/ reviewed her blood work from the summer. She does have PCOS and prediabetes, 2 co morbid conditions associated with the obesity. I counseled the patient on medications that could be used to help her condition, including spironolactone  for the hair growth and Wegovy  for prediabetes/ weight loss.    Most recent fall risk assessment:     No data to display           Most recent depression screenings:    02/04/2023    2:53 PM 09/04/2022   11:13 AM  PHQ 2/9 Scores  PHQ - 2 Score 0 6  PHQ- 9 Score 1 24    Vision:Not within last year  and Dental: No current dental problems and No regular dental care   Patient Active Problem List   Diagnosis Date Noted   Iron deficiency anemia due to chronic blood loss 02/06/2023   Vitamin D  deficiency 09/05/2022   Abnormal facial hair 09/05/2022   Morbid obesity (HCC) 09/04/2022   Daytime somnolence 09/04/2022   Depression, major, single episode, moderate (HCC) 09/04/2022   Keloid of skin 04/05/2022      Patient Care  Team: Ozell Heron HERO, MD as PCP - General (Family Medicine)   Outpatient Medications Prior to Visit  Medication Sig   albuterol  (VENTOLIN  HFA) 108 (90 Base) MCG/ACT inhaler Inhale 2 puffs into the lungs every 6 (six) hours as needed for wheezing or shortness of breath.   methocarbamol  (ROBAXIN ) 500 MG tablet Take 1 tablet (500 mg total) by mouth every 8 (eight) hours as needed for muscle spasms.   naproxen  (NAPROSYN ) 500 MG tablet Take 1 tablet (500 mg total) by mouth 2 (two) times daily as needed.   potassium chloride  SA (KLOR-CON  M) 20 MEQ tablet Take 1 tablet (20 mEq total) by mouth 2 (two) times daily.   [DISCONTINUED] metroNIDAZOLE  (FLAGYL ) 500 MG tablet Take 1 tablet (500 mg total) by mouth 2 (two) times daily.   [DISCONTINUED] venlafaxine  XR (EFFEXOR -XR) 75 MG 24 hr capsule TAKE 1 CAPSULE BY MOUTH DAILY WITH BREAKFAST.   [DISCONTINUED] Vitamin D , Ergocalciferol , (DRISDOL ) 1.25 MG (50000 UNIT) CAPS capsule Take 1 capsule (50,000 Units total) by mouth every 7 (seven) days.   No facility-administered medications prior to visit.    Review of Systems  HENT:  Negative for hearing loss.   Eyes:  Negative for blurred vision.  Respiratory:  Negative for shortness of breath.   Cardiovascular:  Negative for chest pain.  Gastrointestinal: Negative.   Genitourinary: Negative.   Musculoskeletal:  Negative for  back pain.  Neurological:  Negative for headaches.  Psychiatric/Behavioral:  Negative for depression.        Objective:     BP 122/68   Pulse 75   Temp 99 F (37.2 C) (Oral)   Ht 5' 6.75 (1.695 m)   Wt 293 lb 12.8 oz (133.3 kg)   LMP 01/17/2023   SpO2 100%   BMI 46.36 kg/m    Physical Exam Vitals reviewed. Exam conducted with a chaperone present.  Constitutional:      Appearance: Normal appearance. She is morbidly obese.  Cardiovascular:     Rate and Rhythm: Normal rate and regular rhythm.     Pulses: Normal pulses.     Heart sounds: Normal heart sounds.   Pulmonary:     Effort: Pulmonary effort is normal.     Breath sounds: Normal breath sounds.  Chest:     Chest wall: No mass.  Breasts:    Tanner Score is 5.     Right: Normal. No mass or tenderness.     Left: Normal. No mass or tenderness.  Abdominal:     General: Bowel sounds are normal.     Palpations: Abdomen is soft.  Genitourinary:    General: Normal vulva.     Exam position: Lithotomy position.     Tanner stage (genital): 5.     Vagina: Normal. No vaginal discharge or bleeding.     Cervix: Normal. No discharge, friability or lesion.     Rectum: Normal.  Lymphadenopathy:     Upper Body:     Right upper body: No axillary adenopathy.     Left upper body: No axillary adenopathy.  Neurological:     General: No focal deficit present.     Mental Status: She is alert and oriented to person, place, and time.  Psychiatric:        Mood and Affect: Mood and affect normal.          Assessment & Plan:    Routine Health Maintenance and Physical Exam   There is no immunization history on file for this patient.  Health Maintenance  Topic Date Due   COVID-19 Vaccine (1) Never done   Pneumococcal Vaccine 58-67 Years old (1 of 2 - PCV) Never done   HPV VACCINES (1 - Risk 3-dose series) Never done   DTaP/Tdap/Td (1 - Tdap) Never done   INFLUENZA VACCINE  Never done   Cervical Cancer Screening (Pap smear)  02/03/2026   Hepatitis C Screening  Completed   HIV Screening  Completed    Discussed health benefits of physical activity, and encouraged her to engage in regular exercise appropriate for her age and condition.  Routine general medical examination at a health care facility  Vitamin D  deficiency Assessment & Plan: Needs new level checked today  Orders: -     VITAMIN D  25 Hydroxy (Vit-D Deficiency, Fractures)  Iron deficiency anemia due to chronic blood loss Assessment & Plan: Continue ferrous sulfate  325 mg daily.   Orders: -     Ferrous Sulfate ; Take 1 tablet  (325 mg total) by mouth daily with breakfast.  Dispense: 90 tablet; Refill: 1  Morbid obesity (HCC) Assessment & Plan: Pt definitely meets criteria for wegovy  therapy. She has multiple co morbid conditions associated with the obesity. Will start patient on Wegovy  therapy and see her back in 3 months for follow up weight check. As an addendum the patient tried the calorie/ carb deficits that I gave her in the  August 2024 visit, however she was unsuccessful losing weight on her own and the medication is medically necessary to achieve weight loss, reduce the risk of OSA and reduce her risk of developing full diabetes. The patient was counseled to continue the diet and exercise plan that I gave her in August 2024. Patient voiced understanding that the diet will be used alongside the medication. As a refresher, here are the calorie and carb goals that I gave her at the previous visit:  Total number of calories per day: 2000 calories per day   Total amount of protein per day: 130 grams per day   Total amount of carbs per day: less than 90 grams per day    Orders: -     Wegovy ; Inject 0.25 mg into the skin once a week.  Dispense: 2 mL; Refill: 0 -     Wegovy ; Inject 0.5 mg into the skin once a week.  Dispense: 2 mL; Refill: 0 -     Wegovy ; Inject 1 mg into the skin once a week.  Dispense: 2 mL; Refill: 0  Abnormal facial hair -     Spironolactone ; Take 1 tablet (25 mg total) by mouth daily.  Dispense: 90 tablet; Refill: 0  Cervical cancer screening -     Cytology - PAP  Normal physical exam findings except for morbid obesity. Pap smear taken for cervical cancer screening. Handouts given on healthy eating and exercise.   Return in about 3 months (around 05/05/2023) for weight loss-- prediabetes.     Heron CHRISTELLA Sharper, MD

## 2023-02-05 ENCOUNTER — Other Ambulatory Visit: Payer: Self-pay | Admitting: Family Medicine

## 2023-02-05 DIAGNOSIS — E559 Vitamin D deficiency, unspecified: Secondary | ICD-10-CM

## 2023-02-05 LAB — VITAMIN D 25 HYDROXY (VIT D DEFICIENCY, FRACTURES): VITD: 25.92 ng/mL — ABNORMAL LOW (ref 30.00–100.00)

## 2023-02-05 MED ORDER — VITAMIN D (ERGOCALCIFEROL) 1.25 MG (50000 UNIT) PO CAPS: 50000.0000 [IU] | ORAL_CAPSULE | ORAL | 1 refills | Status: AC

## 2023-02-06 ENCOUNTER — Encounter: Payer: Self-pay | Admitting: Family Medicine

## 2023-02-06 DIAGNOSIS — D500 Iron deficiency anemia secondary to blood loss (chronic): Secondary | ICD-10-CM | POA: Insufficient documentation

## 2023-02-06 LAB — CYTOLOGY - PAP
Comment: NEGATIVE
Diagnosis: NEGATIVE
High risk HPV: NEGATIVE

## 2023-02-06 NOTE — Assessment & Plan Note (Signed)
Needs new level checked today

## 2023-02-06 NOTE — Assessment & Plan Note (Addendum)
Pt definitely meets criteria for wegovy therapy. She has multiple co morbid conditions associated with the obesity. Will start patient on Wegovy therapy and see her back in 3 months for follow up weight check. As an addendum the patient tried the calorie/ carb deficits that I gave her in the August 2024 visit, however she was unsuccessful losing weight on her own and the medication is medically necessary to achieve weight loss, reduce the risk of OSA and reduce her risk of developing full diabetes. The patient was counseled to continue the diet and exercise plan that I gave her in August 2024. Patient voiced understanding that the diet will be used alongside the medication. As a refresher, here are the calorie and carb goals that I gave her at the previous visit:  Total number of calories per day: 2000 calories per day   Total amount of protein per day: 130 grams per day   Total amount of carbs per day: less than 90 grams per day

## 2023-02-06 NOTE — Assessment & Plan Note (Signed)
Continue ferrous sulfate 325 mg daily.

## 2023-02-08 ENCOUNTER — Telehealth: Payer: Self-pay | Admitting: Pharmacist

## 2023-02-08 ENCOUNTER — Other Ambulatory Visit (HOSPITAL_COMMUNITY): Payer: Self-pay

## 2023-02-08 NOTE — Telephone Encounter (Signed)
PA request has been Submitted. New Encounter created for follow up. For additional info see Pharmacy Prior Auth telephone encounter from 02/08/2023.

## 2023-02-08 NOTE — Telephone Encounter (Signed)
Pharmacy Patient Advocate Encounter   Received notification from CoverMyMeds that prior authorization for [facility] 0.25MG /0.5ML auto-injectors is required/requested.   Insurance verification completed.   The patient is insured through [facility] [employer] .   Per test claim: PA required; PA submitted to above mentioned insurance via CoverMyMeds Key/confirmation #/EOC [id number] Status is pending

## 2023-02-08 NOTE — Telephone Encounter (Signed)
I have not submitted medical records for this medication.  Message sent to Prior auth team to see if they can submit the notes.

## 2023-02-08 NOTE — Telephone Encounter (Signed)
Thank you for letting me know! In my progress note on 09/04/2022 I had a more extended conversation with her about diet and exercise-- I put specific calorie and carb goals in the after visit  summary and documented them in that note. Can you use the 09/04/22 note in conjunction with the note on 02/04/2023?

## 2023-02-08 NOTE — Telephone Encounter (Signed)
  I work with the Prior Authorization team and am unable to provide the requested information based on the notes from the most recent office visit, which state:  Assessment & Plan: The patient definitely meets the criteria for [facility] therapy. She has multiple comorbid conditions associated with obesity. We will initiate Wegovy therapy and follow up in three months for a weight check.  Insurance companies are increasingly stringent in their requirements for GLP-1 approvals, particularly regarding documentation of lifestyle modifications. They now require detailed records of diet recommendations, exercise plans, and evidence of the patient's commitment to these efforts during each visit. Ensuring thorough documentation of these aspects is essential for successful prior authorization.   Thank you, Dellie Burns, PharmD Clinical Pharmacist  [place]  Direct Dial: [phone].

## 2023-02-08 NOTE — Telephone Encounter (Signed)
Prior Authorization form/request for [facility] asks a question that requires your assistance. Please see the question below and advise accordingly.

## 2023-02-11 NOTE — Telephone Encounter (Signed)
Pharmacy Patient Advocate Encounter  Received notification from [facility] that Prior Authorization for [facility] 0.25MG /0.5ML auto-injectors has been DENIED.  See denial reason below. No denial letter attached in CMM. Will attach denial letter to Media tab once received.   PA #/Case ID/Reference #: : [id number]     Chart notes from 09/04/2022 and 02/04/2023 were submitted with the original PA.  An appeal can be submitted if you would like and it may help if the lifestyle/diet modification notes from 09/04/2022 were also in the 02/04/2023 notes.  They want to make sure it is discussed and documented at each visit.  Please let me know how you would like to proceed.  Thanks, Dellie Burns, PharmD Clinical Pharmacist  [place]  Direct Dial: [phone]

## 2023-02-12 ENCOUNTER — Telehealth: Payer: Self-pay

## 2023-02-12 NOTE — Telephone Encounter (Signed)
Ok I addended the note to reflect the conversation that I had with the patient more accurately. In the future I will try to be more explicit with my language about what we specifically discuss in the counseling portion of the visit. Please try to submit an appeal and I would be forever grateful!

## 2023-02-12 NOTE — Telephone Encounter (Signed)
Copied from CRM [id number]. Topic: Clinical - Medication Question >> Feb 12, 2023  3:51 PM Almira Coaster wrote: Reason for CRM: Huntley Dec from [place] [employer] is calling regarding the Denial for [facility]. She states that we can call [phone] and provide accurate documentation and it will be approved.

## 2023-02-13 ENCOUNTER — Telehealth: Payer: Self-pay | Admitting: Pharmacist

## 2023-02-13 NOTE — Telephone Encounter (Signed)
See prior note from PA team.

## 2023-02-13 NOTE — Telephone Encounter (Signed)
Appeal has been submitted for [facility]. Will advise when response is received. Spoke with [facility] and faxed the requested information to [phone].      Thank you, Dellie Burns, PharmD Clinical Pharmacist  [place]  Direct Dial: [phone]

## 2023-02-13 NOTE — Telephone Encounter (Signed)
Noted  

## 2023-03-04 MED ORDER — WEGOVY 0.5 MG/0.5ML ~~LOC~~ SOAJ: 0.5000 mg | SUBCUTANEOUS | 0 refills | Status: DC

## 2023-03-15 ENCOUNTER — Other Ambulatory Visit (HOSPITAL_COMMUNITY): Payer: Self-pay

## 2023-03-19 ENCOUNTER — Other Ambulatory Visit: Payer: Self-pay

## 2023-03-19 ENCOUNTER — Emergency Department (HOSPITAL_BASED_OUTPATIENT_CLINIC_OR_DEPARTMENT_OTHER): Payer: Medicaid Other

## 2023-03-19 ENCOUNTER — Emergency Department (HOSPITAL_BASED_OUTPATIENT_CLINIC_OR_DEPARTMENT_OTHER)
Admission: EM | Admit: 2023-03-19 | Discharge: 2023-03-20 | Disposition: A | Discharge status: Home / Self Care | Payer: Medicaid Other | Attending: Emergency Medicine | Admitting: Emergency Medicine

## 2023-03-19 DIAGNOSIS — T782XXA Anaphylactic shock, unspecified, initial encounter: Secondary | ICD-10-CM | POA: Diagnosis not present

## 2023-03-19 DIAGNOSIS — R0602 Shortness of breath: Secondary | ICD-10-CM | POA: Diagnosis not present

## 2023-03-19 DIAGNOSIS — Z7951 Long term (current) use of inhaled steroids: Secondary | ICD-10-CM | POA: Diagnosis not present

## 2023-03-19 DIAGNOSIS — J45909 Unspecified asthma, uncomplicated: Secondary | ICD-10-CM | POA: Insufficient documentation

## 2023-03-19 MED ORDER — ALBUTEROL SULFATE (2.5 MG/3ML) 0.083% IN NEBU
2.5000 mg | INHALATION_SOLUTION | Freq: Once | RESPIRATORY_TRACT | Status: DC
  Filled 2023-03-19 (×2): qty 3

## 2023-03-19 MED ORDER — EPINEPHRINE 0.3 MG/0.3ML IJ SOAJ
0.3000 mg | Freq: Once | INTRAMUSCULAR | Status: AC
  Administered 2023-03-19: 0.3 mg via INTRAMUSCULAR
  Filled 2023-03-19: qty 0.3

## 2023-03-19 MED ORDER — DIPHENHYDRAMINE HCL 25 MG PO CAPS
25.0000 mg | ORAL_CAPSULE | Freq: Once | ORAL | Status: AC
  Administered 2023-03-19: 25 mg via ORAL
  Filled 2023-03-19: qty 1

## 2023-03-19 MED ORDER — DEXAMETHASONE 4 MG PO TABS
8.0000 mg | ORAL_TABLET | Freq: Once | ORAL | Status: AC
  Administered 2023-03-19: 8 mg via ORAL
  Filled 2023-03-19: qty 2

## 2023-03-19 MED ORDER — ALBUTEROL SULFATE HFA 108 (90 BASE) MCG/ACT IN AERS: 2.0000 | INHALATION_SPRAY | RESPIRATORY_TRACT | Status: DC | PRN

## 2023-03-19 NOTE — ED Triage Notes (Signed)
 Pt POV reporting asthma exacerbation, labored and slowed responses in triage. Also reports generalized hives that she first noticed two days ago, denies itching.

## 2023-03-19 NOTE — ED Provider Notes (Signed)
 [place] EMERGENCY DEPARTMENT AT [facility] Provider Note   CSN: [id number] Arrival date & time: 03/19/23  2317     History {Add pertinent medical, surgical, social history, OB history to HPI:1} Chief Complaint  Patient presents with   Shortness of Breath    Brooke Wall is a 23 y.o. female.  The history is provided by the patient.  Patient history of asthma and obesity presents with shortness of breath and possible allergic reaction.  Patient reports last week she increased her Wegovy medications Over the past 2 days she has had increasing rash that she thinks is hives.  Tonight she started feeling short of breath and her throat started feeling different as it was closing.  No vomiting or diarrhea.  No syncope.  She has a history of asthma but this feels different.  No other new exposures. She has tried albuterol at home with no relief    Past Medical History:  Diagnosis Date   Asthma     Home Medications Prior to Admission medications   Medication Sig Start Date End Date Taking? Authorizing Provider  albuterol (VENTOLIN HFA) 108 (90 Base) MCG/ACT inhaler Inhale 2 puffs into the lungs every 6 (six) hours as needed for wheezing or shortness of breath. 11/12/21   Leath-Warren, Sadie Haber, NP  ferrous sulfate 325 (65 FE) MG EC tablet Take 1 tablet (325 mg total) by mouth daily with breakfast. 02/04/23   Karie Georges, MD  Semaglutide-Weight Management (WEGOVY) 0.25 MG/0.5ML SOAJ Inject 0.25 mg into the skin once a week. 02/04/23   Karie Georges, MD  Semaglutide-Weight Management [facility]) 0.5 MG/0.5ML SOAJ Inject 0.5 mg into the skin once a week. 03/04/23   Karie Georges, MD  Semaglutide-Weight Management [facility]) 1 MG/0.5ML SOAJ Inject 1 mg into the skin once a week. 02/04/23   Karie Georges, MD  spironolactone (ALDACTONE) 25 MG tablet Take 1 tablet (25 mg total) by mouth daily. 02/04/23   Karie Georges, MD  Vitamin D, Ergocalciferol, (DRISDOL) 1.25  MG (50000 UNIT) CAPS capsule Take 1 capsule (50,000 Units total) by mouth every 7 (seven) days. 02/05/23   Karie Georges, MD      Allergies    Patient has no known allergies.    Review of Systems   Review of Systems  Constitutional:  Negative for fever.  Respiratory:  Positive for shortness of breath.   Gastrointestinal:  Negative for diarrhea and vomiting.  Skin:  Positive for rash.  Neurological:  Negative for syncope.    Physical Exam Updated Vital Signs BP 123/68 (BP Location: Right Arm)   Pulse 83   Temp 98.2 F (36.8 C)   Resp 18   SpO2 100%  Physical Exam CONSTITUTIONAL: Well developed/well nourished, mildly distressed HEAD: Normocephalic/atraumatic EYES: EOMI/PERRL ENMT: Mucous membranes moist, no angioedema, no stridor, no dysphonia but the patient whispers during the exam NECK: supple no meningeal signs CV: S1/S2 noted, no murmurs/rubs/gallops noted LUNGS: Lungs are clear to auscultation bilaterally, tachypneic ABDOMEN: soft, nontender NEURO: Pt is awake/alert/appropriate, moves all extremitiesx4.  No facial droop.   EXTREMITIES: pulses normal/equal, full ROM SKIN: warm, scattered rash noted to extremities and abdomen PSYCH: anxious  ED Results / Procedures / Treatments   Labs (all labs ordered are listed, but only abnormal results are displayed) Labs Reviewed - No data to display  EKG EKG Interpretation Date/Time:  Tuesday March 19 2023 23:25:23 EST Ventricular Rate:  79 PR Interval:  192 QRS Duration:  84 QT Interval:  370 QTC Calculation: 424 R Axis:   56  Text Interpretation: Normal sinus rhythm Normal ECG When compared with ECG of 28-Jan-2023 00:21, No significant change was found Confirmed by Zadie Rhine ([id number]) on 03/19/2023 11:30:04 PM  Radiology No results found.  Procedures .Critical Care  Performed by: Zadie Rhine, MD Authorized by: Zadie Rhine, MD   Critical care provider statement:    Critical care start time:   03/19/2023 11:48 PM   Critical care end time:  03/19/2023 11:48 PM   Critical care time was exclusive of:  Separately billable procedures and treating other patients   Critical care was necessary to treat or prevent imminent or life-threatening deterioration of the following conditions:  Respiratory failure   Critical care was time spent personally by me on the following activities:  Examination of patient, evaluation of patient's response to treatment, re-evaluation of patient's condition, ordering and performing treatments and interventions and review of old charts   I assumed direction of critical care for this patient from another provider in my specialty: no     {Document cardiac monitor, telemetry assessment procedure when appropriate:1}  Medications Ordered in ED Medications  albuterol (PROVENTIL) (2.5 MG/3ML) 0.083% nebulizer solution 2.5 mg (has no administration in time range)  EPINEPHrine (EPI-PEN) injection 0.3 mg (has no administration in time range)  diphenhydrAMINE (BENADRYL) capsule 25 mg (has no administration in time range)  dexamethasone (DECADRON) tablet 8 mg (has no administration in time range)    ED Course/ Medical Decision Making/ A&P   {   Click here for ABCD2, HEART and other calculatorsREFRESH Note before signing :1}                              Medical Decision Making Amount and/or Complexity of Data Reviewed Radiology: ordered.  Risk Prescription drug management.   This patient presents to the ED for concern of shortness of breath, this involves an extensive number of treatment options, and is a complaint that carries with it a high risk of complications and morbidity.  The differential diagnosis includes but is not limited to Acute coronary syndrome, pneumonia, acute pulmonary edema, pneumothorax, acute anemia, pulmonary embolism Anaphylaxis  Comorbidities that complicate the patient evaluation: Patient's presentation is complicated by their history of  obesity and asthma  Social Determinants of Health: Patient's  multiple ER visits   increases the complexity of managing their presentation  Additional history obtained: Records reviewed Primary Care Documents  Lab Tests: I Ordered, and personally interpreted labs.  The pertinent results include:  ***  Imaging Studies ordered: I ordered imaging studies including {imaging:26848}  I independently visualized and interpreted imaging which showed *** I agree with the radiologist interpretation  Cardiac Monitoring: The patient was maintained on a cardiac monitor.  I personally viewed and interpreted the cardiac monitor which showed an underlying rhythm of:  {cardiac monitor:26849}  Medicines ordered and prescription drug management: I ordered medication including EpiPen and for presumed allergic reaction Reevaluation of the patient after these medicines showed that the patient    {resolved/improved/worsened:23923::"improved"}   Critical Interventions:   EpiPen  Consultations Obtained: I requested consultation with the {consultation:26851}, and discussed  findings as well as pertinent plan - they recommend: ***  Reevaluation: After the interventions noted above, I reevaluated the patient and found that they have :{resolved/improved/worsened:23923::"improved"}  Complexity of problems addressed: Patient's presentation is most consistent with  {IONG:29528}  Disposition: After consideration of the diagnostic results and the  patient's response to treatment,  I feel that the patent would benefit from {disposition:26850}.     {Document critical care time when appropriate:1} {Document review of labs and clinical decision tools ie heart score, Chads2Vasc2 etc:1}  {Document your independent review of radiology images, and any outside records:1} {Document your discussion with family members, caretakers, and with consultants:1} {Document social determinants of health affecting pt's  care:1} {Document your decision making why or why not admission, treatments were needed:1} Final Clinical Impression(s) / ED Diagnoses Final diagnoses:  None    Rx / DC Orders ED Discharge Orders     None

## 2023-03-20 DIAGNOSIS — R0602 Shortness of breath: Secondary | ICD-10-CM | POA: Diagnosis not present

## 2023-03-20 MED ORDER — EPINEPHRINE 0.3 MG/0.3ML IJ SOAJ: 0.3000 mg | INTRAMUSCULAR | 1 refills | Status: AC | PRN

## 2023-03-20 NOTE — Discharge Instructions (Addendum)
 Stop wegovy and followup with your doctor

## 2023-04-02 ENCOUNTER — Telehealth: Admitting: Physician Assistant

## 2023-04-02 DIAGNOSIS — T887XXA Unspecified adverse effect of drug or medicament, initial encounter: Secondary | ICD-10-CM | POA: Diagnosis not present

## 2023-04-02 DIAGNOSIS — L270 Generalized skin eruption due to drugs and medicaments taken internally: Secondary | ICD-10-CM

## 2023-04-02 MED ORDER — PREDNISONE 10 MG (21) PO TBPK: ORAL_TABLET | ORAL | 0 refills | Status: DC

## 2023-04-02 NOTE — Progress Notes (Signed)
 Virtual Visit Consent   Brooke Wall, you are scheduled for a virtual visit with a  provider today. Just as with appointments in the office, your consent must be obtained to participate. Your consent will be active for this visit and any virtual visit you may have with one of our providers in the next 365 days. If you have a MyChart account, a copy of this consent can be sent to you electronically.  As this is a virtual visit, video technology does not allow for your provider to perform a traditional examination. This may limit your provider's ability to fully assess your condition. If your provider identifies any concerns that need to be evaluated in person or the need to arrange testing (such as labs, EKG, etc.), we will make arrangements to do so. Although advances in technology are sophisticated, we cannot ensure that it will always work on either your end or our end. If the connection with a video visit is poor, the visit may have to be switched to a telephone visit. With either a video or telephone visit, we are not always able to ensure that we have a secure connection.  By engaging in this virtual visit, you consent to the provision of healthcare and authorize for your insurance to be billed (if applicable) for the services provided during this visit. Depending on your insurance coverage, you may receive a charge related to this service.  I need to obtain your verbal consent now. Are you willing to proceed with your visit today? Brooke Wall has provided verbal consent on 04/02/2023 for a virtual visit (video or telephone). Brooke Loveless, PA-C  Date: 04/02/2023 12:29 PM   Virtual Visit via Video Note   I, Brooke Wall, connected with  Brooke Wall  ([id number], [birth year]) on 04/02/23 at 12:15 PM EDT by a video-enabled telemedicine application and verified that I am speaking with the correct person using two identifiers.  Location: Patient: Virtual Visit Location  Patient: Home Provider: Virtual Visit Location Provider: Home Office   I discussed the limitations of evaluation and management by telemedicine and the availability of in person appointments. The patient expressed understanding and agreed to proceed.    History of Present Illness: Brooke Wall is a 23 y.o. who identifies as a female who was assigned female at birth, and is being seen today for diffuse rash.  HPI: Rash This is a new problem. Episode onset: Started 03/07/23 on left arm, then spread to torso and legs. The problem has been gradually worsening since onset. The rash is diffuse. The rash is characterized by dryness, scaling and redness. She was exposed to a new medication (Started Wegovy at the end of January prior to rash onset). Pertinent negatives include no congestion, facial edema, fatigue or shortness of breath. Past treatments include nothing. The treatment provided no relief.     Problems:  Patient Active Problem List   Diagnosis Date Noted   Iron deficiency anemia due to chronic blood loss 02/06/2023   Vitamin D deficiency 09/05/2022   Abnormal facial hair 09/05/2022   Morbid obesity (HCC) 09/04/2022   Daytime somnolence 09/04/2022   Depression, major, single episode, moderate (HCC) 09/04/2022   Keloid of skin 04/05/2022    Allergies: No Known Allergies Medications:  Current Outpatient Medications:    predniSONE (STERAPRED UNI-PAK 21 TAB) 10 MG (21) TBPK tablet, 6 day taper; take as directed on package instructions, Disp: 21 tablet, Rfl: 0   albuterol (VENTOLIN HFA) 108 (90 Base) MCG/ACT inhaler,  Inhale 2 puffs into the lungs every 6 (six) hours as needed for wheezing or shortness of breath., Disp: 8 g, Rfl: 0   EPINEPHrine 0.3 mg/0.3 mL IJ SOAJ injection, Inject 0.3 mg into the muscle as needed for anaphylaxis., Disp: 1 each, Rfl: 1   ferrous sulfate 325 (65 FE) MG EC tablet, Take 1 tablet (325 mg total) by mouth daily with breakfast., Disp: 90 tablet, Rfl: 1    Semaglutide-Weight Management (WEGOVY) 0.25 MG/0.5ML SOAJ, Inject 0.25 mg into the skin once a week., Disp: 2 mL, Rfl: 0   Semaglutide-Weight Management (WEGOVY) 0.5 MG/0.5ML SOAJ, Inject 0.5 mg into the skin once a week., Disp: 2 mL, Rfl: 0   Semaglutide-Weight Management (WEGOVY) 1 MG/0.5ML SOAJ, Inject 1 mg into the skin once a week., Disp: 2 mL, Rfl: 0   spironolactone (ALDACTONE) 25 MG tablet, Take 1 tablet (25 mg total) by mouth daily., Disp: 90 tablet, Rfl: 0   Vitamin D, Ergocalciferol, (DRISDOL) 1.25 MG (50000 UNIT) CAPS capsule, Take 1 capsule (50,000 Units total) by mouth every 7 (seven) days., Disp: 12 capsule, Rfl: 1  Observations/Objective: Patient is well-developed, well-nourished in no acute distress.  Resting comfortably at home.  Head is normocephalic, atraumatic.  No labored breathing.  Speech is clear and coherent with logical content.  Patient is alert and oriented at baseline.  Diffuse rash on arms, torso and legs. There are fine papular lesions and larger macules that are annular with scaling.  Assessment and Plan: 1. Medication side effect (Primary) - predniSONE (STERAPRED UNI-PAK 21 TAB) 10 MG (21) TBPK tablet; 6 day taper; take as directed on package instructions  Dispense: 21 tablet; Refill: 0  2. Allergic drug rash - predniSONE (STERAPRED UNI-PAK 21 TAB) 10 MG (21) TBPK tablet; 6 day taper; take as directed on package instructions  Dispense: 21 tablet; Refill: 0  - Possible medication side effect; Did discuss some lesions appear fungal, but patient has no source for fungal, but advised to monitor closely if rash worsens with prednisone she is to stop and follow back up for potential antifungals - Wegovy stopped - Prednisone for rash - Moisturize well - Follow up if not improving or symptoms worsen  Follow Up Instructions: I discussed the assessment and treatment plan with the patient. The patient was provided an opportunity to ask questions and all were  answered. The patient agreed with the plan and demonstrated an understanding of the instructions.  A copy of instructions were sent to the patient via MyChart unless otherwise noted below.    The patient was advised to call back or seek an in-person evaluation if the symptoms worsen or if the condition fails to improve as anticipated.    Brooke Loveless, PA-C

## 2023-04-02 NOTE — Patient Instructions (Signed)
 Brooke Wall, thank you for joining Margaretann Loveless, PA-C for today's virtual visit.  While this provider is not your primary care provider (PCP), if your PCP is located in our provider database this encounter information will be shared with them immediately following your visit.   A [facility] MyChart account gives you access to today's visit and all your visits, tests, and labs performed at [facility] " click here if you don't have a [facility] MyChart account or go to [email or web address]  Consent: (Patient) Brooke Wall provided verbal consent for this virtual visit at the beginning of the encounter.  Current Medications:  Current Outpatient Medications:    predniSONE (STERAPRED UNI-PAK 21 TAB) 10 MG (21) TBPK tablet, 6 day taper; take as directed on package instructions, Disp: 21 tablet, Rfl: 0   albuterol (VENTOLIN HFA) 108 (90 Base) MCG/ACT inhaler, Inhale 2 puffs into the lungs every 6 (six) hours as needed for wheezing or shortness of breath., Disp: 8 g, Rfl: 0   EPINEPHrine 0.3 mg/0.3 mL IJ SOAJ injection, Inject 0.3 mg into the muscle as needed for anaphylaxis., Disp: 1 each, Rfl: 1   ferrous sulfate 325 (65 FE) MG EC tablet, Take 1 tablet (325 mg total) by mouth daily with breakfast., Disp: 90 tablet, Rfl: 1   Semaglutide-Weight Management (WEGOVY) 0.25 MG/0.5ML SOAJ, Inject 0.25 mg into the skin once a week., Disp: 2 mL, Rfl: 0   Semaglutide-Weight Management (WEGOVY) 0.5 MG/0.5ML SOAJ, Inject 0.5 mg into the skin once a week., Disp: 2 mL, Rfl: 0   Semaglutide-Weight Management (WEGOVY) 1 MG/0.5ML SOAJ, Inject 1 mg into the skin once a week., Disp: 2 mL, Rfl: 0   spironolactone (ALDACTONE) 25 MG tablet, Take 1 tablet (25 mg total) by mouth daily., Disp: 90 tablet, Rfl: 0   Vitamin D, Ergocalciferol, (DRISDOL) 1.25 MG (50000 UNIT) CAPS capsule, Take 1 capsule (50,000 Units total) by mouth every 7 (seven) days., Disp: 12 capsule, Rfl: 1   Medications ordered  in this encounter:  Meds ordered this encounter  Medications   predniSONE (STERAPRED UNI-PAK 21 TAB) 10 MG (21) TBPK tablet    Sig: 6 day taper; take as directed on package instructions    Dispense:  21 tablet    Refill:  0    Supervising Provider:   Merrilee Jansky [[id number]]     *If you need refills on other medications prior to your next appointment, please contact your pharmacy*  Follow-Up: Call back or seek an in-person evaluation if the symptoms worsen or if the condition fails to improve as anticipated.  [facility] [phone]  Other Instructions  Drug Allergy: What to Know A drug allergy happens when your body's defense system (immune system) reacts badly to a medicine. Drug allergies range from mild to severe. A drug allergy isn't the same as having a side effect from a medicine. Symptoms of an allergic reaction often appear 1 to 2 hours after taking the medicine. Some might not appear until a week or more later. You should talk to your health care provider about any allergic reactions to a medicine, even if the reaction seems mild. A sudden and severe allergic reaction that affects the whole body is called an anaphylactic reaction or anaphylaxis. This can be life-threatening. Reactions that are mild the first time can come back and be severe the next time. What are the causes? A drug allergy is caused by the response of the immune system. When allergic reactions happen,  the body releases histamine and proteins into the blood. These substances can cause swelling in certain tissues. This can reduce blood flow to important areas like the heart and lungs. Medicines that commonly cause reactions include: Antibiotics like penicillin. Sulfa drugs. Medicines used to numb certain areas of the body. X-ray dyes that have iodine in them. Pain relievers, such as aspirin or ibuprofen. Chemotherapy drugs for treating cancer. Medicines for autoimmune diseases like  rheumatoid arthritis. What are the signs or symptoms? Common symptoms of a mild allergic reaction include: Swelling of the sinuses. Tingling of the mouth or tongue. An itchy, red rash. Common symptoms of a severe allergic reaction include: Swelling of the face, lips, tongue, or mouth. You may also have: Swelling of the back of the throat. Trouble speaking or swallowing. Making high-pitched whistling sounds when you breathe. This is called wheezing. Itchy, red, swollen areas of skin called hives. Feeling dizzy or fainting. Confusion or feeling worried or nervous. Chest tightness and fast or irregular heartbeats (palpitations). Belly pain, throwing up, or having watery poop (diarrhea). How is this diagnosed?  A drug allergy is diagnosed based on a physical exam and your recent use of the medicine. You may need to see a provider who specializes in allergies. Testing can be done to confirm an allergy or find out which medicines you're allergic to. Tests may include: Skin tests. These may involve: Injecting a small amount of the medicine into your skin. Putting patches on your skin. Blood tests. Drug challenge. For this test, a provider gives you a small amount of a medicine in gradual doses while watching for a reaction. If you're not sure what caused your allergic reaction, your provider may ask you for: Details about all the medicines you take. The date and time of your reaction. How is this treated? There's no cure for an allergy. Anaphylaxis usually needs to be treated right away in a hospital. This is important because a severe reaction may happen again within 72 hours. An allergic reaction can be treated with: Medicines that help: Reduce swelling. Relieve itching and hives. Reduce pain. Inhalers. These are medicines that help open the airways in your lungs. Shots, or injections, of a medicine that helps you breathe easier after anaphylaxis. This is called epinephrine. You may  be prescribed epinephrine. It comes in many forms, including an auto-injector pen. This is a device filled with the medicine that's used to give a shot. Follow these instructions at home: If you have a severe allergy:  Always keep an auto-injector pen or your anaphylaxis kit near you. This can be lifesaving if you have a severe reaction. Use your auto-injector pen or kit as told by your provider. Replace your auto-injector pen or kit right away after using it. Wear a medical alert bracelet or necklace that shows you have a drug allergy. Make sure you know how to use your auto-injector pen or kit. Also teach the people you live with and your employer how to use it. General instructions Avoid medicines that you're allergic to. Take medicines only as told. If you were given medicines to treat your allergic reaction, do not drive until your provider tells you it's safe. If you have hives or a rash: Use over-the-counter medicine as told by your provider. Put cold, wet cloths on your skin or take baths or showers in cool water. Avoid hot water. If you had tests done, ask when your results will be ready and how to get them. You may need to  call or meet with your provider to get your results. Tell your family members, the people you live with, and all your providers that you have a drug allergy. Contact a health care provider if: You think you're having a mild allergic reaction. You have symptoms that last more than 2 days after your reaction. You have new symptoms. Get help right away if: You need to use epinephrine. You have any symptoms of a severe allergic reaction. These symptoms may be an emergency. Use your auto-injector pen or anaphylaxis kit as you've been told, and call 911 right away. Do not wait to see if the symptoms will go away. Do not drive yourself to the hospital. This information is not intended to replace advice given to you by your health care provider. Make sure you discuss  any questions you have with your health care provider. Document Revised: 09/11/2022 Document Reviewed: 09/11/2022 Elsevier Patient Education  2024 [employer]   If you have been instructed to have an in-person evaluation today at a local Urgent Care facility, please use the link below. It will take you to a list of all of our available [facility], including address, phone number and hours of operation. Please do not delay care.  [facility]  If you or a family member do not have a primary care provider, use the link below to schedule a visit and establish care. When you choose a [facility] primary care physician or advanced practice provider, you gain a long-term partner in health. Find a Primary Care Provider  Learn more about [facility] in-office and virtual care options:  - Get Care Now

## 2023-04-12 ENCOUNTER — Telehealth: Admitting: Family Medicine

## 2023-04-12 DIAGNOSIS — J111 Influenza due to unidentified influenza virus with other respiratory manifestations: Secondary | ICD-10-CM | POA: Diagnosis not present

## 2023-04-12 MED ORDER — OSELTAMIVIR PHOSPHATE 75 MG PO CAPS
75.0000 mg | ORAL_CAPSULE | Freq: Two times a day (BID) | ORAL | 0 refills | Status: AC
Start: 2023-04-12 — End: 2023-04-17

## 2023-04-12 NOTE — Progress Notes (Signed)

## 2023-05-13 ENCOUNTER — Ambulatory Visit (INDEPENDENT_AMBULATORY_CARE_PROVIDER_SITE_OTHER): Payer: Medicaid Other | Admitting: Family Medicine

## 2023-05-13 ENCOUNTER — Encounter: Payer: Self-pay | Admitting: Family Medicine

## 2023-05-13 ENCOUNTER — Other Ambulatory Visit: Payer: Self-pay | Admitting: Family Medicine

## 2023-05-13 DIAGNOSIS — R400 Somnolence: Secondary | ICD-10-CM | POA: Diagnosis not present

## 2023-05-13 DIAGNOSIS — Z1159 Encounter for screening for other viral diseases: Secondary | ICD-10-CM | POA: Diagnosis not present

## 2023-05-13 DIAGNOSIS — Z209 Contact with and (suspected) exposure to unspecified communicable disease: Secondary | ICD-10-CM

## 2023-05-13 DIAGNOSIS — Z5320 Procedure and treatment not carried out because of patient's decision for unspecified reasons: Secondary | ICD-10-CM

## 2023-05-13 MED ORDER — TIRZEPATIDE-WEIGHT MANAGEMENT 2.5 MG/0.5ML ~~LOC~~ SOLN: 2.5000 mg | SUBCUTANEOUS | 0 refills | Status: AC

## 2023-05-13 NOTE — Progress Notes (Addendum)
 Established Patient Office Visit  Subjective   Patient ID: Brooke Wall, female    DOB: 2000/05/31  Age: 23 y.o. MRN: [id number]  Chief Complaint  Patient presents with   Medical Management of Chronic Issues    Pt states that she was able to get the wegovy  and she took 2 injections, however she broke out in a rash and went to the ER. She was told it was likely an allergic reaction to the wegovy  and was instructed to stop the wegovy . She was given an epipen  and steroids and the rash resolved. Pt states they told her to follow up with her PCP. I reviewed the ED notes and labs/imaging studies that were done.   Patient's weight is down to 276 -- worked very well for her for the 6 weeks she was on the injection, has lost 17 pounds since January. She states that the medication did help with her appetite but the rash caused concern because she was afraid she was allergic to it.     Current Outpatient Medications  Medication Instructions   albuterol  (VENTOLIN  HFA) 108 (90 Base) MCG/ACT inhaler 2 puffs, Inhalation, Every 6 hours PRN   EPINEPHrine  (EPI-PEN) 0.3 mg, Intramuscular, As needed   ferrous sulfate  325 mg, Oral, Daily with breakfast   spironolactone  (ALDACTONE ) 25 mg, Oral, Daily   tirzepatide (ZEPBOUND) 2.5 mg, Subcutaneous, Weekly   Vitamin D  (Ergocalciferol ) (DRISDOL ) 50,000 Units, Oral, Every 7 days    Patient Active Problem List   Diagnosis Date Noted   Iron deficiency anemia due to chronic blood loss 02/06/2023   Vitamin D  deficiency 09/05/2022   Abnormal facial hair 09/05/2022   Morbid obesity (HCC) 09/04/2022   Daytime somnolence 09/04/2022   Depression, major, single episode, moderate (HCC) 09/04/2022   Keloid of skin 04/05/2022      Review of Systems  All other systems reviewed and are negative.     Objective:     BP 122/68   Pulse 85   Temp 98.9 F (37.2 C) (Oral)   Ht 5' 6.75" (1.695 m)   Wt 276 lb 12.8 oz (125.6 kg)   LMP 04/16/2023 (Exact Date)    SpO2 98%   BMI 43.68 kg/m    Physical Exam Vitals reviewed.  Constitutional:      Appearance: Normal appearance. She is well-groomed. She is morbidly obese.  Neck:     Thyroid : No thyromegaly.  Cardiovascular:     Rate and Rhythm: Normal rate and regular rhythm.     Heart sounds: S1 normal and S2 normal.  Pulmonary:     Effort: Pulmonary effort is normal.     Breath sounds: Normal breath sounds and air entry.  Musculoskeletal:     Right lower leg: No edema.     Left lower leg: No edema.  Neurological:     Mental Status: She is alert and oriented to person, place, and time. Mental status is at baseline.     Gait: Gait is intact.  Psychiatric:        Mood and Affect: Mood and affect normal.        Speech: Speech normal.        Behavior: Behavior normal.       The ASCVD Risk score (Arnett DK, et al., 2019) failed to calculate for the following reasons:   The 2019 ASCVD risk score is only valid for ages 31 to 9    Assessment & Plan:  Morbid obesity [facility]) Assessment & Plan: Patient had  rash/urticarial lesions across her stomach and the shortness of breath which are now resolved. We discussed switching her to tirzepatide due to the allergic Reaction. Rash is listed in the adverse reactions in wegovy ,   As an addendum the patient tried the calorie/ carb deficits that I gave her in the August 2024 visit, however she was unsuccessful losing weight on her own and the medication is medically necessary to achieve weight loss, reduce the risk of OSA and reduce her risk of developing full diabetes. The patient was counseled to continue the diet and exercise plan that I gave her in August 2024. Patient voiced understanding that the diet will be used alongside the medication. As a refresher, here are the calorie and carb goals that I gave her at the previous visit:   Total number of calories per day: 2000 calories per day   Total amount of protein per day: 130 grams per day   Total  amount of carbs per day: less than 90 grams per day  Orders: -     Tirzepatide-Weight Management; Inject 2.5 mg into the skin once a week.  Dispense: 2 mL; Refill: 0 -     Pulmonary Visit  Daytime somnolence Assessment & Plan: Pt never went to the pulmonologist, will place new referral  Orders: -     Pulmonary Visit  Need for hepatitis C screening test -     Hepatitis C antibody; Future  HIV screening declined -     HIV Antibody (routine testing w rflx); Future  Exposure to communicable disease -     RPR; Future     Return in about 3 months (around 08/12/2023) for weight loss.    Aida House, MD

## 2023-05-13 NOTE — Assessment & Plan Note (Addendum)
 Patient had rash/urticarial lesions across her stomach and the shortness of breath which are now resolved. We discussed switching her to tirzepatide due to the allergic Reaction. Rash is listed in the adverse reactions in wegovy ,   As an addendum the patient tried the calorie/ carb deficits that I gave her in the August 2024 visit, however she was unsuccessful losing weight on her own and the medication is medically necessary to achieve weight loss, reduce the risk of OSA and reduce her risk of developing full diabetes. The patient was counseled to continue the diet and exercise plan that I gave her in August 2024. Patient voiced understanding that the diet will be used alongside the medication. As a refresher, here are the calorie and carb goals that I gave her at the previous visit:   Total number of calories per day: 2000 calories per day   Total amount of protein per day: 130 grams per day   Total amount of carbs per day: less than 90 grams per day

## 2023-05-14 ENCOUNTER — Other Ambulatory Visit (HOSPITAL_COMMUNITY): Payer: Self-pay

## 2023-05-14 ENCOUNTER — Encounter: Payer: Self-pay | Admitting: Family Medicine

## 2023-05-14 ENCOUNTER — Encounter (HOSPITAL_BASED_OUTPATIENT_CLINIC_OR_DEPARTMENT_OTHER): Payer: Self-pay

## 2023-05-14 ENCOUNTER — Telehealth: Payer: Self-pay

## 2023-05-14 LAB — RPR: RPR Ser Ql: NONREACTIVE

## 2023-05-14 LAB — HIV ANTIBODY (ROUTINE TESTING W REFLEX): HIV 1&2 Ab, 4th Generation: NONREACTIVE

## 2023-05-14 LAB — HEPATITIS C ANTIBODY: Hepatitis C Ab: NONREACTIVE

## 2023-05-14 NOTE — Telephone Encounter (Signed)
 Pharmacy Patient Advocate Encounter   Received notification from RX Request Messages that prior authorization for Zepbound 2.5mg /0.66ml is required/requested.   Insurance verification completed.   The patient is insured through [facility] MEDICAID .   Per test claim: PA required; PA submitted to above mentioned insurance via CoverMyMeds Key/confirmation #/EOC [id number] Status is pending

## 2023-05-14 NOTE — Telephone Encounter (Signed)
 Patient had adverse reaction to wegovy -- can we send this to the prior auth team?

## 2023-05-18 NOTE — Assessment & Plan Note (Signed)
 Pt never went to the pulmonologist, will place new referral

## 2023-05-21 NOTE — Telephone Encounter (Signed)
 Pharmacy Patient Advocate Encounter  Received notification from [facility] that Prior Authorization for Zepbound 2.5MG /0.5ML pen-injectors has been DENIED.  Full denial letter will be uploaded to the media tab. See denial reason below.  Per your health plan's criteria, this drug is covered if you meet the following: Your doctor submits medical records (for example, chart notes, assessments, treatment plans, monitoring plans) confirming you are currently on and will continue lifestyle changes (including structured nutrition and physical activity, unless physical activity is not clinically appropriate at the time the requested therapy commences).  PA #/Case ID/Reference #: [id number]

## 2023-05-21 NOTE — Telephone Encounter (Signed)
 See prior message-Rx denied.

## 2023-05-22 ENCOUNTER — Encounter: Payer: Self-pay | Admitting: Primary Care

## 2023-05-22 ENCOUNTER — Ambulatory Visit (INDEPENDENT_AMBULATORY_CARE_PROVIDER_SITE_OTHER): Admitting: Primary Care

## 2023-05-22 VITALS — BP 127/79 | HR 98 | Temp 98.4°F | Ht 67.0 in | Wt 280.8 lb

## 2023-05-22 DIAGNOSIS — G4719 Other hypersomnia: Secondary | ICD-10-CM | POA: Diagnosis not present

## 2023-05-22 DIAGNOSIS — Z6841 Body Mass Index (BMI) 40.0 and over, adult: Secondary | ICD-10-CM | POA: Diagnosis not present

## 2023-05-22 DIAGNOSIS — E669 Obesity, unspecified: Secondary | ICD-10-CM | POA: Diagnosis not present

## 2023-05-22 DIAGNOSIS — G4700 Insomnia, unspecified: Secondary | ICD-10-CM

## 2023-05-22 DIAGNOSIS — U070 Vaping-related disorder: Secondary | ICD-10-CM

## 2023-05-22 DIAGNOSIS — E282 Polycystic ovarian syndrome: Secondary | ICD-10-CM | POA: Diagnosis not present

## 2023-05-22 NOTE — Progress Notes (Signed)
 @Patient  ID: Brooke Wall, female    DOB: September 23, 2000, 23 y.o.   MRN: [id number]  No chief complaint on file.   Referring provider: Aida House, MD  HPI: 23 year old female, vapes. PMH significant daytime somnolence, depression, vit D deficiency, iron deficiency anemia, obesity.   05/22/2023 Discussed the use of AI scribe software for clinical note transcription with the patient, who gave verbal consent to proceed.  History of Present Illness   Brooke Wall is a 23 year old female who presents with insomnia and daytime sleepiness for a sleep consult.  She has experienced insomnia and excessive daytime sleepiness since around the age of 54. Her sleep pattern is irregular, often going to bed around 3 AM due to her work schedule, which involves shifts from 5:30 PM to 12:30 AM or later. On days off, she sometimes sleeps until 4 PM. It can take a few seconds to a few hours to fall asleep, and she often wakes up several times during the night. She has experienced gasping for air a couple of times upon waking.  There is a family history of sleep apnea, as her father had the condition. She has not undergone a sleep study before. She has attempted to obtain authorization for Zepbound for weight loss, which was denied. Her maximum weight was 303 pounds, and she has since lost some weight, currently weighing around 276 pounds. She mentions a baseline weight of 220 pounds for many years.  She has been diagnosed with polycystic ovary syndrome (PCOS) and has tried medications like Wegovy , which caused rashes. She is currently on spironolactone  for hair growth. She does not smoke but vapes daily, although she is unsure of the contents. She does not drink alcohol regularly, only socially.  No medications causing fatigue. She reports excessive daytime sleepiness, scoring 20 out of 24 on the Epworth Sleepiness Scale. She occasionally experiences sudden sleep attacks, sometimes falling asleep while  on the phone.      No Known Allergies   There is no immunization history on file for this patient.  Past Medical History:  Diagnosis Date   Asthma     Tobacco History: Social History   Tobacco Use  Smoking Status Never  Smokeless Tobacco Never  Tobacco Comments   Currently uses vapes   Counseling given: Not Answered Tobacco comments: Currently uses vapes   Outpatient Medications Prior to Visit  Medication Sig Dispense Refill   albuterol  (VENTOLIN  HFA) 108 (90 Base) MCG/ACT inhaler Inhale 2 puffs into the lungs every 6 (six) hours as needed for wheezing or shortness of breath. 8 g 0   EPINEPHrine  0.3 mg/0.3 mL IJ SOAJ injection Inject 0.3 mg into the muscle as needed for anaphylaxis. 1 each 1   ferrous sulfate  325 (65 FE) MG EC tablet Take 1 tablet (325 mg total) by mouth daily with breakfast. 90 tablet 1   spironolactone  (ALDACTONE ) 25 MG tablet Take 1 tablet (25 mg total) by mouth daily. 90 tablet 0   tirzepatide (ZEPBOUND) 2.5 MG/0.5ML injection vial Inject 2.5 mg into the skin once a week. 2 mL 0   Vitamin D , Ergocalciferol , (DRISDOL ) 1.25 MG (50000 UNIT) CAPS capsule Take 1 capsule (50,000 Units total) by mouth every 7 (seven) days. 12 capsule 1   No facility-administered medications prior to visit.   Review of Systems  Review of Systems  Constitutional: Negative.   HENT: Negative.    Respiratory: Negative.     Physical Exam  LMP 04/16/2023 (Exact Date)  Physical Exam Constitutional:      General: She is not in acute distress.    Appearance: Normal appearance. She is not ill-appearing.  HENT:     Head: Normocephalic and atraumatic.     Mouth/Throat:     Mouth: Mucous membranes are moist.     Pharynx: Oropharynx is clear.  Cardiovascular:     Rate and Rhythm: Normal rate and regular rhythm.  Pulmonary:     Effort: Pulmonary effort is normal.     Breath sounds: Normal breath sounds.  Musculoskeletal:     Cervical back: Normal range of motion and neck  supple.  Neurological:     General: No focal deficit present.     Mental Status: She is alert and oriented to person, place, and time. Mental status is at baseline.  Psychiatric:        Mood and Affect: Mood normal.        Behavior: Behavior normal.        Thought Content: Thought content normal.        Judgment: Judgment normal.      Lab Results:  CBC    Component Value Date/Time   WBC 9.4 01/28/2023 0008   RBC 5.80 (H) 01/28/2023 0008   HGB 10.8 (L) 01/28/2023 0008   HCT 36.2 01/28/2023 0008   PLT 472 (H) 01/28/2023 0008   MCV 62.4 (L) 01/28/2023 0008   MCH 18.6 (L) 01/28/2023 0008   MCHC 29.8 (L) 01/28/2023 0008   RDW 20.2 (H) 01/28/2023 0008   LYMPHSABS 2.7 01/23/2019 0358   MONOABS 0.7 01/23/2019 0358   EOSABS 0.1 01/23/2019 0358   BASOSABS 0.0 01/23/2019 0358    BMET    Component Value Date/Time   NA 141 01/28/2023 0008   K 3.1 (L) 01/28/2023 0008   CL 105 01/28/2023 0008   CO2 25 01/28/2023 0008   GLUCOSE 130 (H) 01/28/2023 0008   BUN 8 01/28/2023 0008   CREATININE 0.87 01/28/2023 0008   CALCIUM 9.3 01/28/2023 0008   GFRNONAA >60 01/28/2023 0008   GFRAA >60 01/23/2019 0358    BNP No results found for: "BNP"  ProBNP No results found for: "PROBNP"  Imaging: No results found.   Assessment & Plan:   No problem-specific Assessment & Plan notes found for this encounter.  Assessment and Plan    Insomnia and daytime sleepiness Chronic insomnia and excessive daytime sleepiness since adolescence, with irregular sleep schedule due to work hours. High suspicion of sleep apnea due to symptoms of daytime somnolence, irregular sleep patterns, and family history. Differential diagnosis includes narcolepsy if sleep apnea is ruled out. Sleep apnea can cause fragmented sleep and fatigue due to lack of deep sleep or REM sleep. - Order home sleep study to evaluate for sleep apnea. - If sleep apnea is confirmed, recommend CPAP therapy as it is the standard treatment   - If sleep apnea is not confirmed, consider in-lab nap (MSLT) test to evaluate for narcolepsy. - Advise on maintaining a regular sleep schedule and exposure to sunlight and exercise to regulate circadian rhythm. - Advise against driving or operating machinery when excessively tired.  Obesity Weight is 276 lbs, with previous maximum of 303 lbs. Obesity may contribute to sleep apnea and other health issues. Zepbound may aid in weight loss and has approval indications for obesity and sleep apnea. - Consider Zepbound for weight loss if sleep apnea is confirmed and insurance approves. - Encourage weight loss to potentially reduce sleep apnea severity.  Vaping  habit Vapes daily but is unsure of the contents. Vaping can cause lung damage and lead to chronic obstructive pulmonary disease (COPD). - Counsel on the risks of vaping and encourage cessation.  Polycystic Ovary Syndrome (PCOS) PCOS diagnosis confirmed by another provider. Previous treatment with Wegovy  resulted in adverse reactions. PCOS may contribute to hormonal imbalances and weight issues. - Consider Zepbound for weight management if sleep apnea is confirmed and insurance approves.    Antonio Baumgarten, NP 05/22/2023

## 2023-05-22 NOTE — Patient Instructions (Addendum)
-  INSOMNIA AND DAYTIME SLEEPINESS: You have been experiencing chronic insomnia and excessive daytime sleepiness since adolescence, likely due to your irregular sleep schedule and possible sleep apnea. Sleep apnea is a condition where your breathing repeatedly stops and starts during sleep, leading to poor sleep quality. We will conduct a home sleep study to check for sleep apnea. If confirmed, CPAP therapy will be recommended. If not, we may consider further testing for narcolepsy. Additionally, try to maintain a regular sleep schedule, get sunlight exposure, and exercise to help regulate your sleep patterns. Avoid driving or operating machinery when you are very tired.  -OBESITY: Your current weight is 276 lbs, which may contribute to sleep apnea and other health issues. Obesity is a condition where excess body fat negatively affects your health. If sleep apnea is confirmed, we will consider Zepbound for weight loss, pending insurance approval. Continue working on weight loss as it may help reduce the severity of sleep apnea.  -VAPING HABIT: You vape daily but are unsure of the contents, which can cause lung damage and lead to chronic obstructive pulmonary disease (COPD). Vaping involves inhaling vapor from an electronic cigarette or similar device. We discussed nicotine replacement options like gum and the risks associated with vaping. I encourage you to consider quitting.  -POLYCYSTIC OVARY SYNDROME (PCOS): You have been diagnosed with PCOS, which can cause hormonal imbalances and weight issues. PCOS is a condition that affects a woman's hormone levels. If sleep apnea is confirmed, we will consider Zepbound for weight management, pending insurance approval.  INSTRUCTIONS: Please complete the home sleep study as ordered. Follow up with us  once the results are available so we can discuss the next steps. Maintain a regular sleep schedule, get sunlight exposure, and exercise regularly. Avoid driving or  operating machinery when excessively tired. Consider nicotine replacement options and work towards quitting vaping. Continue working on weight loss and follow up with your primary care provider for ongoing management of PCOS.  Follow-up: 2 months with Jerlene Moody NP

## 2023-05-23 NOTE — Telephone Encounter (Signed)
 Ok I went back and addended my note from 05/13/23 -- patient had allergic reaction to wegovy  and that is why I'm trying to switch her to zepbound

## 2023-05-23 NOTE — Telephone Encounter (Signed)
 Information has been sent to clinical pharmacist for appeals review. It may take 5-7 days to prepare the necessary documentation to request the appeal from the insurance.

## 2023-05-24 ENCOUNTER — Telehealth: Payer: Self-pay | Admitting: Pharmacist

## 2023-05-24 NOTE — Telephone Encounter (Signed)
 Appeal has been submitted for Zepbound. Will advise when response is received, please be advised that most companies may take 30 days to make a decision. Appeal letter and supporting documentation have been faxed to [phone] on 05/24/2023 @10 :04 am.  Thank you, Dene Fines, PharmD Clinical Pharmacist  [place]  Direct Dial: [phone]

## 2023-06-04 ENCOUNTER — Encounter: Payer: Self-pay | Admitting: Family Medicine

## 2023-06-04 ENCOUNTER — Telehealth: Admitting: Family Medicine

## 2023-06-04 DIAGNOSIS — J029 Acute pharyngitis, unspecified: Secondary | ICD-10-CM

## 2023-06-04 MED ORDER — AMOXICILLIN 500 MG PO TABS: 500.0000 mg | ORAL_TABLET | Freq: Two times a day (BID) | ORAL | 0 refills | Status: AC

## 2023-06-04 NOTE — Progress Notes (Signed)

## 2023-06-05 ENCOUNTER — Telehealth: Admitting: Physician Assistant

## 2023-06-05 DIAGNOSIS — B9689 Other specified bacterial agents as the cause of diseases classified elsewhere: Secondary | ICD-10-CM

## 2023-06-05 NOTE — Progress Notes (Signed)
 Patient just needing work note extended. Seen and treated yesterday, 06/04/23.  No charge.

## 2023-06-06 DIAGNOSIS — F4310 Post-traumatic stress disorder, unspecified: Secondary | ICD-10-CM | POA: Diagnosis not present

## 2023-06-13 DIAGNOSIS — F4310 Post-traumatic stress disorder, unspecified: Secondary | ICD-10-CM | POA: Diagnosis not present

## 2023-06-18 ENCOUNTER — Other Ambulatory Visit (HOSPITAL_COMMUNITY): Payer: Self-pay

## 2023-06-18 NOTE — Telephone Encounter (Signed)
 The insurance has approved the appeal for Zepbound, it is effective through 11/28/2023.    Thank you, Dene Fines, PharmD Clinical Pharmacist  [place]  Direct Dial: [phone]

## 2023-06-18 NOTE — Telephone Encounter (Signed)
 Left a detailed message with the approval information below on the pharmacy voicemail at [facility].

## 2023-06-21 ENCOUNTER — Encounter (HOSPITAL_BASED_OUTPATIENT_CLINIC_OR_DEPARTMENT_OTHER): Admitting: Pulmonary Disease

## 2023-06-27 ENCOUNTER — Telehealth: Payer: Self-pay | Admitting: Primary Care

## 2023-06-27 DIAGNOSIS — F4310 Post-traumatic stress disorder, unspecified: Secondary | ICD-10-CM | POA: Diagnosis not present

## 2023-06-27 NOTE — Telephone Encounter (Signed)
 Yes

## 2023-06-27 NOTE — Telephone Encounter (Signed)
 Brooke Wall, we have made multiple attempts to contact the patient regarding the scheduling of her sleep study. As we have not received a response or confirmation of interest. Can we close out the referral at this time and would you still like to have a follow up appointment with the patient?

## 2023-07-05 ENCOUNTER — Encounter

## 2023-07-05 NOTE — Telephone Encounter (Signed)
 Pt has no showed Home Sleep appointment. Patient's numbers does not go through. We are closing out referral due to no show.

## 2023-07-18 ENCOUNTER — Telehealth: Admitting: Physician Assistant

## 2023-07-18 DIAGNOSIS — R202 Paresthesia of skin: Secondary | ICD-10-CM

## 2023-07-18 DIAGNOSIS — R112 Nausea with vomiting, unspecified: Secondary | ICD-10-CM

## 2023-07-18 NOTE — Progress Notes (Signed)
  Because of atypical symptoms -- tingling in hands, and eye changes, I feel your condition warrants further evaluation and I recommend that you be seen in a face-to-face visit.   NOTE: There will be NO CHARGE for this E-Visit   If you are having a true medical emergency, please call 911.     For an urgent face to face visit, [place] has multiple urgent care centers for your convenience.  Click the link below for the full list of locations and hours, walk-in wait times, appointment scheduling options and driving directions:  Urgent Care - [place], [place], [place], [place], [place], [place]  [place]     Your MyChart E-visit questionnaire answers were reviewed by a board certified advanced clinical practitioner to complete your personal care plan based on your specific symptoms.    Thank you for using e-Visits.

## 2023-07-23 ENCOUNTER — Telehealth: Payer: Self-pay

## 2023-07-23 NOTE — Telephone Encounter (Signed)
 Left VM for PT to call to resched appt tomorrow. Will send [facility]

## 2023-07-23 NOTE — Telephone Encounter (Signed)
 The patient no-showed for her Home Sleep Test on 07/05/23. We attempted to contact her multiple times, but she has not responded. She viewed the appointment in MyChart on 07/09/23 and is aware she has not rescheduled for the Home Sleep Test.

## 2023-07-23 NOTE — Telephone Encounter (Signed)
 Pt is scheduled to see Brooke Ferrari, NP tomorrow, 07-24-23, for a f/u on her home sleep test. The home sleep test has never been scheduled. [facility], can we look into this please? Her appointment tomorrow will also need to be rescheduled since the test is not completed.

## 2023-07-23 NOTE — Telephone Encounter (Signed)
 LM. Per protocol 3 attempts made. Closing out.

## 2023-07-24 ENCOUNTER — Ambulatory Visit: Admitting: Primary Care

## 2023-07-24 ENCOUNTER — Encounter: Payer: Self-pay | Admitting: Primary Care

## 2023-08-12 ENCOUNTER — Telehealth: Payer: Self-pay | Admitting: Family Medicine

## 2023-08-12 ENCOUNTER — Ambulatory Visit: Admitting: Family Medicine

## 2023-08-13 ENCOUNTER — Ambulatory Visit: Admitting: Family Medicine

## 2023-08-20 ENCOUNTER — Ambulatory Visit: Admitting: Family Medicine

## 2023-09-12 ENCOUNTER — Ambulatory Visit: Admitting: Family Medicine

## 2023-10-11 ENCOUNTER — Telehealth: Admitting: Physician Assistant

## 2023-10-11 DIAGNOSIS — J029 Acute pharyngitis, unspecified: Secondary | ICD-10-CM

## 2023-10-11 NOTE — Progress Notes (Signed)
 We are sorry that you are not feeling well.  Here is how we plan to help!  Your symptoms indicate a likely viral infection (Pharyngitis).   Pharyngitis is inflammation in the back of the throat which can cause a sore throat, scratchiness and sometimes difficulty swallowing.   Pharyngitis is typically caused by a respiratory virus and will just run its course.  Please keep in mind that your symptoms could last up to 10 days.  For throat pain, we recommend over the counter oral pain relief medications such as acetaminophen or aspirin, or anti-inflammatory medications such as ibuprofen or naproxen  sodium.  Topical treatments such as oral throat lozenges or sprays may be used as needed.  Avoid close contact with loved ones, especially the very young and elderly.  Remember to wash your hands thoroughly throughout the day as this is the number one way to prevent the spread of infection and wipe down door knobs and counters with disinfectant.  After careful review of your answers, I would not recommend and antibiotic for your condition.  Antibiotics should not be used to treat conditions that we suspect are caused by viruses like the virus that causes the common cold or flu. However, some people can have Strep with atypical symptoms. You may need formal testing in clinic or office to confirm if your symptoms continue or worsen.  If no improvement recommend in person evaluation at Urgent Care or with your Primary Care Physician.   Providers prescribe antibiotics to treat infections caused by bacteria. Antibiotics are very powerful in treating bacterial infections when they are used properly.  To maintain their effectiveness, they should be used only when necessary.  Overuse of antibiotics has resulted in the development of super bugs that are resistant to treatment!    Home Care: Only take medications as instructed by your medical team. Do not drink alcohol while taking these medications. A steam or ultrasonic  humidifier can help congestion.  You can place a towel over your head and breathe in the steam from hot water coming from a faucet. Avoid close contacts especially the very young and the elderly. Cover your mouth when you cough or sneeze. Always remember to wash your hands.  Get Help Right Away If: You develop worsening fever or throat pain. You develop a severe head ache or visual changes. Your symptoms persist after you have completed your treatment plan.  Make sure you Understand these instructions. Will watch your condition. Will get help right away if you are not doing well or get worse.  Your e-visit answers were reviewed by a board certified advanced clinical practitioner to complete your personal care plan.  Depending on the condition, your plan could have included both over the counter or prescription medications.  If there is a problem please reply  once you have received a response from your provider.  Your safety is important to us .  If you have drug allergies check your prescription carefully.    You can use MyChart to ask questions about todays visit, request a non-urgent call back, or ask for a work or school excuse for 24 hours related to this e-Visit. If it has been greater than 24 hours you will need to follow up with your provider, or enter a new e-Visit to address those concerns.  You will get an e-mail in the next two days asking about your experience.  I hope that your e-visit has been valuable and will speed your recovery. Thank you for using e-visits.  I have spent 5 minutes in review of e-visit questionnaire, review and updating patient chart, medical decision making and response to patient.   Harlene PEDLAR Ward, PA-C

## 2023-12-03 ENCOUNTER — Other Ambulatory Visit: Payer: Self-pay

## 2023-12-03 ENCOUNTER — Emergency Department (HOSPITAL_BASED_OUTPATIENT_CLINIC_OR_DEPARTMENT_OTHER): Admitting: Radiology

## 2023-12-03 ENCOUNTER — Emergency Department (HOSPITAL_BASED_OUTPATIENT_CLINIC_OR_DEPARTMENT_OTHER)
Admission: EM | Admit: 2023-12-03 | Discharge: 2023-12-03 | Disposition: A | Discharge status: Home / Self Care | Attending: Emergency Medicine | Admitting: Emergency Medicine

## 2023-12-03 DIAGNOSIS — S39012A Strain of muscle, fascia and tendon of lower back, initial encounter: Secondary | ICD-10-CM | POA: Diagnosis not present

## 2023-12-03 DIAGNOSIS — K0889 Other specified disorders of teeth and supporting structures: Secondary | ICD-10-CM | POA: Diagnosis not present

## 2023-12-03 DIAGNOSIS — X500XXA Overexertion from strenuous movement or load, initial encounter: Secondary | ICD-10-CM | POA: Diagnosis not present

## 2023-12-03 DIAGNOSIS — M549 Dorsalgia, unspecified: Secondary | ICD-10-CM | POA: Diagnosis not present

## 2023-12-03 DIAGNOSIS — M545 Low back pain, unspecified: Secondary | ICD-10-CM | POA: Diagnosis present

## 2023-12-03 LAB — URINALYSIS, ROUTINE W REFLEX MICROSCOPIC
Bilirubin Urine: NEGATIVE
Glucose, UA: NEGATIVE mg/dL
Hgb urine dipstick: NEGATIVE
Ketones, ur: NEGATIVE mg/dL
Leukocytes,Ua: NEGATIVE
Nitrite: NEGATIVE
Protein, ur: NEGATIVE mg/dL
Specific Gravity, Urine: 1.033 — ABNORMAL HIGH (ref 1.005–1.030)
pH: 5.5 (ref 5.0–8.0)

## 2023-12-03 LAB — PREGNANCY, URINE: Preg Test, Ur: NEGATIVE

## 2023-12-03 MED ORDER — LIDOCAINE 5 % EX PTCH
2.0000 | MEDICATED_PATCH | CUTANEOUS | Status: DC
  Administered 2023-12-03: 2 via TRANSDERMAL
  Filled 2023-12-03 (×2): qty 2

## 2023-12-03 MED ORDER — CYCLOBENZAPRINE HCL 10 MG PO TABS: 5.0000 mg | ORAL_TABLET | Freq: Every evening | ORAL | 0 refills | Status: AC | PRN

## 2023-12-03 MED ORDER — LIDOCAINE 5 % EX PTCH: 2.0000 | MEDICATED_PATCH | CUTANEOUS | 0 refills | Status: DC

## 2023-12-03 NOTE — ED Triage Notes (Signed)
 Pt POV reporting lower back pain after moving washer machine, also reporting dental pain past few days.

## 2023-12-03 NOTE — ED Notes (Signed)
 Patient transported to X-ray

## 2023-12-03 NOTE — Discharge Instructions (Addendum)
 Your x-ray of the lower back did not show any abnormalities to explain your pain.  The pain you are having is likely from a muscle strain.  This will start to improve on its own within the next month.  Please engage in light physical activity (like walking) to prevent your back pain from worsening and to prevent stiffness. Refrain from bedrest which can make your pain worse.   You may use up to 600mg  ibuprofen every 6 hours as needed for pain.  Do not exceed 2.4g of ibuprofen per day.    You may take up to 1000mg  of tylenol every 6 hours as needed for pain.  Do not take more then 4g per day.    You may use a heating pack on your back to help with the pain.  You have been prescribed a muscle relaxer called Flexeril (cyclobenzaprine). You may take 0.5 - 1 tablet (5-10mg ) before bed as needed for muscle pain. This medication can be sedating. Do not drive or operate heavy machinery after taking this medicine. Do not drink alcohol or take other sedating medications when taking this medicine for safety reasons.  Keep this out of reach of small children.    You have been prescribed lidocaine  patched to help with pain. You may apply 1-2 patches to your back for up to 12 hours at a time. Then, you must remove the patch for a full 12 hours before re-applying a new patch.    Please contact your PCP if your back pain does not start to improve over the next 2-3 weeks as you may benefit from a PT referral from your PCP.   Return to the ER if you have loss of bowel or bladder control, you develop fever, you have numbness in your groin, or if you have any other new or concerning symptoms.

## 2023-12-03 NOTE — ED Provider Notes (Signed)
 [place] EMERGENCY DEPARTMENT AT [facility] Provider Note   CSN: [id number] Arrival date & time: 12/03/23  2013     Patient presents with: Back Pain and Dental Pain   Brooke Wall is a 23 y.o. female with no significant past medical history presents with concern for lower back pain that began after lifting a washing machine 4 days ago.  Reports pain is in the midline of her lower back, but does radiate to the sides of her back.  She denies any bowel or bladder incontinence, urinary retention, or saddle anesthesia.  No numbness or weakness in her legs.  She reports taking ibuprofen for pain without significant improvement in symptoms.  She also reports some left-sided lower dental pain that has been ongoing for the past 5 days.  She denies any recent dental work.  Denies any difficulty swallowing.  No fever or chills.    Back Pain Dental Pain      Prior to Admission medications   Medication Sig Start Date End Date Taking? Authorizing Provider  cyclobenzaprine (FLEXERIL) 10 MG tablet Take 0.5-1 tablets (5-10 mg total) by mouth at bedtime as needed. 12/03/23  Yes Veta Palma, PA-C  lidocaine  (LIDODERM ) 5 % Place 2 patches onto the skin daily. Remove & Discard patch within 12 hours or as directed by MD 12/03/23  Yes Veta Palma, PA-C  albuterol  (VENTOLIN  HFA) 108 (90 Base) MCG/ACT inhaler Inhale 2 puffs into the lungs every 6 (six) hours as needed for wheezing or shortness of breath. 11/12/21   Leath-Warren, Etta PARAS, NP  EPINEPHrine  0.3 mg/0.3 mL IJ SOAJ injection Inject 0.3 mg into the muscle as needed for anaphylaxis. 03/20/23   Midge Golas, MD  ferrous sulfate  325 (65 FE) MG EC tablet Take 1 tablet (325 mg total) by mouth daily with breakfast. 02/04/23   Ozell Heron HERO, MD  spironolactone  (ALDACTONE ) 25 MG tablet Take 1 tablet (25 mg total) by mouth daily. 02/04/23   Ozell Heron HERO, MD  tirzepatide  (ZEPBOUND ) 2.5 MG/0.5ML injection vial Inject 2.5  mg into the skin once a week. 05/13/23   Ozell Heron HERO, MD  Vitamin D , Ergocalciferol , (DRISDOL ) 1.25 MG (50000 UNIT) CAPS capsule Take 1 capsule (50,000 Units total) by mouth every 7 (seven) days. 02/05/23   Ozell Heron HERO, MD    Allergies: Patient has no known allergies.    Review of Systems  Musculoskeletal:  Positive for back pain.    Updated Vital Signs BP (!) 146/80 (BP Location: Right Arm)   Pulse 89   Temp (!) 97.5 F (36.4 C)   Resp 17   Ht 5' 7 (1.702 m)   Wt 122.5 kg   SpO2 100%   BMI 42.29 kg/m   Physical Exam Vitals and nursing note reviewed.  Constitutional:      General: She is not in acute distress.    Appearance: She is well-developed.  HENT:     Head: Normocephalic and atraumatic.  Eyes:     Conjunctiva/sclera: Conjunctivae normal.  Cardiovascular:     Rate and Rhythm: Normal rate and regular rhythm.     Heart sounds: No murmur heard.    Comments: 2+ pedal pulses bilaterally Pulmonary:     Effort: Pulmonary effort is normal. No respiratory distress.     Breath sounds: Normal breath sounds.  Abdominal:     Palpations: Abdomen is soft.     Tenderness: There is no abdominal tenderness.  Musculoskeletal:        General: No swelling.  Cervical back: Neck supple.     Comments: Tender to palpation of the lumbar spine.  Nontender over the cervical or thoracic spine.  Tender over the musculature surrounding the lumbar spine bilaterally and also has tenderness into the gluteal musculature bilaterally.  Moves all extremities without difficulty.  Ambulates without difficulty  Skin:    General: Skin is warm and dry.     Capillary Refill: Capillary refill takes less than 2 seconds.  Neurological:     Mental Status: She is alert.     Comments: 5/5 strength with resisted hip flexion and extension bilaterally, knee flexion extension bilaterally, ankle plantarflexion dorsiflexion bilaterally  Intact and symmetric sensation to the bilateral lower  extremities  Psychiatric:        Mood and Affect: Mood normal.     (all labs ordered are listed, but only abnormal results are displayed) Labs Reviewed  URINALYSIS, ROUTINE W REFLEX MICROSCOPIC - Abnormal; Notable for the following components:      Result Value   Specific Gravity, Urine 1.033 (*)    All other components within normal limits  PREGNANCY, URINE    EKG: None  Radiology: DG Lumbar Spine Complete Result Date: 12/03/2023 EXAM: 4 VIEW(S) XRAY OF THE LUMBAR SPINE 12/03/2023 10:34:00 PM COMPARISON: None available. CLINICAL HISTORY: Midline back pain after picking up heavy object. FINDINGS: BONES: No acute fracture. Alignment is normal. DISCS AND DEGENERATIVE CHANGES: No severe degenerative changes. SOFT TISSUES: No acute abnormality. IMPRESSION: 1. No acute abnormality of the lumbar spine. Electronically signed by: Oneil Devonshire MD 12/03/2023 10:35 PM EST RP Workstation: [id number]     Procedures   Medications Ordered in the ED  lidocaine  (LIDODERM ) 5 % 2 patch (has no administration in time range)                                    Medical Decision Making Amount and/or Complexity of Data Reviewed Labs: ordered. Radiology: ordered.  Risk Prescription drug management.     Differential diagnosis includes but is not limited to dental abscess or infection, ludwig angina, osteomyelitis, peritonsillar abscess, gingivitis, dental carries, sialadenitis Musculoskeletal pain, radiculopathy, spinal stenosis, herniated nucleus pulposis, fracture, cauda equina, epidural abscess, shingles, nephrolithiasis    ED Course:  Upon initial evaluation, patient is well appearing in no acute distress.  Patient reporting some left lower dental pain. Patient able to open mouth fully with no trismus. Swallowing secretions without difficulty. Uvula midline. No visualized peritonsillar abscess or dental abscess. Neck is soft and supple without overlying skin change, no edema of the  submandibular space or tongue, low concern for ludwig angina. No not feel this is consistent with infection at this time, will hold on antibiotics. Will have her follow up with a dentist for further routine dental care.  Patient reporting back pain after picking up a washer machine.  She is tender along the lumbar spine as well as the musculature surrounding the lumbar spine bilaterally in the gluteal muscles bilaterally.  She is nontender along the cervical and thoracic spine.  She moves all extremities without difficulty and ambulates without difficulty.  She is neurovascularly intact in the bilateral lower extremities.  X-ray lumbar spine was obtained due to midline tenderness, that showed no acute abnormality.  Patient's physical exam is consistent with lumbar strain.  Patient stable and appropriate for discharge home  Labs Ordered: I Ordered, and personally interpreted labs.  The pertinent results include:  Urinalysis without signs of infection Pregnancy test negative  Imaging Studies ordered: I ordered imaging studies including x-ray lumbar spine I independently visualized the imaging with scope of interpretation limited to determining acute life threatening conditions related to emergency care. Imaging showed no acute abnormalities I agree with the radiologist interpretation   Medications Given: Lidocaine  patch   Impression: Lumbar strain  Disposition:  The patient was discharged home with instructions to take tylenol and ibuprofen as needed for pain.  Flexeril before bed as needed for back pain.  She understands Flexeril may make her drowsy and do not drink alcohol or drive after taking this medication.  Lidocaine  patches on the back as needed for pain.  Follow-up with PCP if back pain not improving within the next 2 weeks.  Establish care with a dentist as soon as possible for further dental care. Return precautions given and patient verbalized understanding.    This chart was  dictated using voice recognition software, Dragon. Despite the best efforts of this provider to proofread and correct errors, errors may still occur which can change documentation meaning.       Final diagnoses:  Lumbar strain, initial encounter  Pain, dental    ED Discharge Orders          Ordered    lidocaine  (LIDODERM ) 5 %  Every 24 hours        12/03/23 2302    cyclobenzaprine (FLEXERIL) 10 MG tablet  At bedtime PRN        12/03/23 2302               Sneijder Bernards, PA-C 12/03/23 2306    Kingsley, Victoria K, DO 12/06/23 1459

## 2023-12-10 ENCOUNTER — Telehealth: Admitting: Emergency Medicine

## 2023-12-10 DIAGNOSIS — R197 Diarrhea, unspecified: Secondary | ICD-10-CM

## 2023-12-10 NOTE — Progress Notes (Signed)
 We are sorry that you are not feeling well.  Here is how we plan to help!  Based on what you have shared with me it looks like you have Acute Infectious Diarrhea.  Most cases of acute diarrhea are due to infections with virus and bacteria and are self-limited conditions lasting less than 14 days.  For your symptoms you may take Imodium 2 mg tablets that are over the counter at your local pharmacy. Take two tablet now and then one after each loose stool up to 6 a day.  Antibiotics are not needed for most people with diarrhea.   HOME CARE We recommend changing your diet to help with your symptoms for the next few days. Drink plenty of fluids that contain water salt and sugar. Sports drinks such as Gatorade may help.  You may try broths, soups, bananas, applesauce, soft breads, mashed potatoes or crackers.  You are considered infectious for as long as the diarrhea continues. Hand washing or use of alcohol based hand sanitizers is recommend. It is best to stay out of work or school until your symptoms stop.   GET HELP RIGHT AWAY If you have dark yellow colored urine or do not pass urine frequently you should drink more fluids.   If your symptoms worsen  If you feel like you are going to pass out (faint) You have a new problem  MAKE SURE YOU  Understand these instructions. Will watch your condition. Will get help right away if you are not doing well or get worse.  Your e-visit answers were reviewed by a board certified advanced clinical practitioner to complete your personal care plan.  Depending on the condition, your plan could have included both over the counter or prescription medications.  If there is a problem please reply  once you have received a response from your provider.  Your safety is important to us .  If you have drug allergies check your prescription carefully.    You can use MyChart to ask questions about today's visit, request a non-urgent call back, or ask for a work  or school excuse for 24 hours related to this e-Visit. If it has been greater than 24 hours you will need to follow up with your provider, or enter a new e-Visit to address those concerns.   You will get an e-mail in the next two days asking about your experience.  I hope that your e-visit has been valuable and will speed your recovery. Thank you for using e-visits.  I have spent 5 minutes in review of e-visit questionnaire, review and updating patient chart, medical decision making and response to patient.   Jon Belt, PhD, FNP-BC

## 2024-02-12 ENCOUNTER — Telehealth: Admitting: Physician Assistant

## 2024-02-12 DIAGNOSIS — K047 Periapical abscess without sinus: Secondary | ICD-10-CM | POA: Diagnosis not present

## 2024-02-12 MED ORDER — AMOXICILLIN-POT CLAVULANATE 875-125 MG PO TABS: 1.0000 | ORAL_TABLET | Freq: Two times a day (BID) | ORAL | 0 refills | Status: AC

## 2024-02-12 NOTE — Progress Notes (Signed)

## 2024-02-18 ENCOUNTER — Encounter: Admitting: Family Medicine

## 2024-02-21 ENCOUNTER — Encounter: Admitting: Family Medicine

## 2024-02-24 ENCOUNTER — Encounter: Admitting: Family Medicine
